# Patient Record
Sex: Male | Born: 1935 | Race: White | Hispanic: No
Health system: Southern US, Community
[De-identification: ages and names within clinical notes are randomized; demographics above are authoritative.]

## PROBLEM LIST (undated history)

## (undated) DIAGNOSIS — F0390 Unspecified dementia without behavioral disturbance: Secondary | ICD-10-CM

## (undated) DIAGNOSIS — F10959 Alcohol use, unspecified with alcohol-induced psychotic disorder, unspecified: Secondary | ICD-10-CM

## (undated) DIAGNOSIS — S7223XA Displaced subtrochanteric fracture of unspecified femur, initial encounter for closed fracture: Secondary | ICD-10-CM

## (undated) DIAGNOSIS — D649 Anemia, unspecified: Secondary | ICD-10-CM

## (undated) DIAGNOSIS — E119 Type 2 diabetes mellitus without complications: Secondary | ICD-10-CM

## (undated) DIAGNOSIS — F1010 Alcohol abuse, uncomplicated: Secondary | ICD-10-CM

## (undated) HISTORY — PX: OTHER SURGICAL HISTORY: SHX169

## (undated) HISTORY — PX: CYSTOSCOPY: SHX5120

## (undated) HISTORY — PX: CHOLECYSTECTOMY: SHX55

---

## 1998-06-12 ENCOUNTER — Emergency Department (HOSPITAL_COMMUNITY): Admission: EM | Admit: 1998-06-12 | Discharge: 1998-06-12 | Payer: Self-pay

## 1998-06-16 ENCOUNTER — Ambulatory Visit (HOSPITAL_COMMUNITY): Admission: RE | Admit: 1998-06-16 | Discharge: 1998-06-16 | Payer: Self-pay

## 1998-07-09 ENCOUNTER — Observation Stay (HOSPITAL_COMMUNITY): Admission: RE | Admit: 1998-07-09 | Discharge: 1998-07-10 | Payer: Self-pay | Admitting: Surgery

## 1998-11-13 ENCOUNTER — Encounter: Payer: Self-pay | Admitting: Emergency Medicine

## 1998-11-13 ENCOUNTER — Emergency Department (HOSPITAL_COMMUNITY): Admission: EM | Admit: 1998-11-13 | Discharge: 1998-11-13 | Payer: Self-pay | Admitting: Emergency Medicine

## 1999-11-27 ENCOUNTER — Emergency Department (HOSPITAL_COMMUNITY): Admission: EM | Admit: 1999-11-27 | Discharge: 1999-11-27 | Payer: Self-pay | Admitting: Emergency Medicine

## 2000-04-08 ENCOUNTER — Emergency Department (HOSPITAL_COMMUNITY): Admission: EM | Admit: 2000-04-08 | Discharge: 2000-04-08 | Payer: Self-pay | Admitting: Emergency Medicine

## 2000-04-09 ENCOUNTER — Encounter: Payer: Self-pay | Admitting: Emergency Medicine

## 2000-10-03 ENCOUNTER — Emergency Department (HOSPITAL_COMMUNITY): Admission: EM | Admit: 2000-10-03 | Discharge: 2000-10-03 | Payer: Self-pay | Admitting: Internal Medicine

## 2000-10-04 ENCOUNTER — Encounter: Payer: Self-pay | Admitting: Otolaryngology

## 2000-10-04 ENCOUNTER — Emergency Department (HOSPITAL_COMMUNITY): Admission: EM | Admit: 2000-10-04 | Discharge: 2000-10-04 | Payer: Self-pay | Admitting: Emergency Medicine

## 2000-10-04 ENCOUNTER — Inpatient Hospital Stay (HOSPITAL_COMMUNITY): Admission: AD | Admit: 2000-10-04 | Discharge: 2000-10-08 | Payer: Self-pay | Admitting: Otolaryngology

## 2002-12-16 ENCOUNTER — Emergency Department (HOSPITAL_COMMUNITY): Admission: EM | Admit: 2002-12-16 | Discharge: 2002-12-17 | Payer: Self-pay | Admitting: Emergency Medicine

## 2002-12-17 ENCOUNTER — Encounter: Payer: Self-pay | Admitting: Emergency Medicine

## 2003-01-14 ENCOUNTER — Inpatient Hospital Stay (HOSPITAL_COMMUNITY): Admission: EM | Admit: 2003-01-14 | Discharge: 2003-02-07 | Payer: Self-pay

## 2004-03-13 ENCOUNTER — Ambulatory Visit (HOSPITAL_COMMUNITY): Admission: RE | Admit: 2004-03-13 | Discharge: 2004-03-13 | Payer: Self-pay | Admitting: Internal Medicine

## 2004-11-22 ENCOUNTER — Emergency Department (HOSPITAL_COMMUNITY): Admission: EM | Admit: 2004-11-22 | Discharge: 2004-11-22 | Payer: Self-pay | Admitting: Emergency Medicine

## 2004-12-21 ENCOUNTER — Ambulatory Visit (HOSPITAL_COMMUNITY): Admission: RE | Admit: 2004-12-21 | Discharge: 2004-12-21 | Payer: Self-pay | Admitting: Urology

## 2007-02-15 ENCOUNTER — Inpatient Hospital Stay (HOSPITAL_COMMUNITY): Admission: EM | Admit: 2007-02-15 | Discharge: 2007-02-17 | Payer: Self-pay | Admitting: Emergency Medicine

## 2007-02-15 ENCOUNTER — Ambulatory Visit: Payer: Self-pay | Admitting: Internal Medicine

## 2007-02-16 ENCOUNTER — Encounter (INDEPENDENT_AMBULATORY_CARE_PROVIDER_SITE_OTHER): Payer: Self-pay | Admitting: Internal Medicine

## 2010-07-14 NOTE — Op Note (Signed)
Kevin Dawson, Kevin Dawson NO.:  [id number]   MEDICAL RECORD NO.:  [id number]          PATIENT TYPE:  INP   LOCATION:  2109                         FACILITY:  [facility]   PHYSICIAN:  Nelda Bucks, MD DATE OF BIRTH:  09-20-1927   DATE OF PROCEDURE:  02/15/2007  DATE OF DISCHARGE:                               OPERATIVE REPORT   CENTRAL LINE NOTE   This is a consult from the teaching service for central line access on a  patient with borderline blood pressure in the emergency room.  This  patient was somewhat agitated with a history of dementia and would not  be cooperative for IJ placement.  Therefore, the location chosen was the  right femoral vein.  This procedure was performed by Dr. Elvera Lennox under  ultrasound direct guidance, and, to note, this patient's vein noted on  ultrasound was right underneath the artery and therefore a somewhat more  lateral approach was taken to have an angle to enter the vein safely.  Patient's right femoral area was prepped extensively with Chlorhexidine  preparation and under ultrasound guidance some lidocaine, 5 mL, 1%  lidocaine was placed for local anesthesia.  I performed this procedure  in supervising type procedure with Dr. Elvera Lennox using Seldinger  technique.  A needle was placed directly under ultrasound guidance into  the vein and a wire was placed successfully and again ultrasound  visualized the wire in the vein and this was compressible femoral vein.  Continuing with Seldinger technique with Chlorhexidine sterile  technique, a central line was placed successfully.  Dark non-pulsatile  blood was noted.  The area was reprepped.  The line was flushed  appropriately and sutured into place.  The patient tolerated this very  very well.      Nelda Bucks, MD  Electronically Signed     DJF/MEDQ  D:  02/15/2007  T:  02/16/2007  Job:  [id number]

## 2010-07-14 NOTE — Discharge Summary (Signed)
NAMEBAIN, WHICHARD NO.:  [id number]   MEDICAL RECORD NO.:  [id number]          PATIENT TYPE:  INP   LOCATION:  5122                         FACILITY:  [facility]   PHYSICIAN:  Alvester Morin, M.D.  DATE OF BIRTH:  Feb 20, 1928   DATE OF ADMISSION:  02/15/2007  DATE OF DISCHARGE:  02/17/2007                               DISCHARGE SUMMARY   DISCHARGE DIAGNOSES:  1. Fever and leukocytosis most likely secondary to pneumonia.  2. Hypotenstion, secondary to dehydration, resolved.  3. Elevated creatinine kinase, probably due to hypotensive episode.  4. Acute on chronic renal failure secondary to dehydration, resolved.  5. Diabetes with a hemoglobin A1c of 5.3.  6. Hypertension.  7. Dementia.  8. Hyperlipidemia - Zocor being held at time of discharge due to      problem # 3   DISCHARGE MEDICATIONS:  1. Avelox 400 mg p.o. qd x 7 days  2. FiberLax 625 two tablets at bedtime  3. Ranitidine 150 mg p.o. daily.  4. Polysporin topical b.i.d.  5. Lisinopril 20 mg by mouth daily - being held.  6. Maxzide 25 mg p.o. daily - being held.  7. Metoprolol 12.5 mg p.o. daily.  8. Razadyne 8 mg p.o. bedtime.  9. Aricept 5 mg p.o. bedtime.  10.Zocor - being held   DISPOSITION:  The patient will be followed up by the physician at his  nursing facility.  His blood pressure medication will need to be added  back as needed. He needs to have his CK checked to assure that it  normalizes and then his Zocor will need to be restarted. He may need to  have meds added back for his diabetes.   PROCEDURES:  None.   CONSULTATIONS:  None.   BRIEF ADMISSION HISTORY AND PHYSICAL:  Mr Boulet is a 77 year old man  with past medical history of traumatic brain injury, hypertension,  diabetes, and dementia who lives in an assisted living facility. He was  brought to the ED after an episode of vomiting the day prior to  admission.  The night prior to admission, he had chills and a  temperature of  101.  He also has a cough.   PHYSICAL EXAMINATION:  VITAL SIGNS:  Temperature 104, blood pressure 131/67, pulse 95,  respirations 24, saturating 96% on room air.  Physical examination was within normal limits except for his  neurological exam. He was alert but oriented only to person. His speech  was fluent but incoherent.   LABORATORY DATA:  Sodium 138, potassium 4.2, chloride 107, bicarbonate  25, BUN 28, creatinine 2.2, glucose 68, white blood cells 12.6, ESE of  12.7, hemoglobin 14.6, MCV 95.5, BNP was 222.  UA was negative.  Chest x-  ray showed limited lung volumes, atelectasis and with vascular  congestion.   HOSPITAL COURSE:  #1 - FEVER AND LEUKOCYTOSIS - The patient had a cough but was unable to  produce sputum for culture. His CXR did not show an infiltrate. Blood  cultures were negative and urinalysis was also negative. He most likely  had a pneumonia not seen on  CXR. He responded to antibiotics and is  discharged on Avelox for an additional 7 days (total of 10 days of  antibiotics).   #2 - HYPOTENSION -  While being evaluated in the ED, the patient's  systolic blood pressure dropped to the 90's. HE received a 2L fluid  bolus to which he responded.  He never required pressors. The  hypotension was most likely due to dehydration.   #3 - ELEVATED CK, most likely due to his episode of hypotension.  Troponins were negative. Zocor was held and can be resumed as outpatient  after CK has normalized.  A TSH was pending at time of discharge.   #4 - ACUTE ON CHRONIC RENAL FAILURE, pre-renal, responded to IVF.  Creatinine was down to 1.7 at discharge (patient's baseline creatinine  ~1.8).   #5 - DIABETES MELLITUS.  Hemoglobin A1C is 5.3.  The patient was on  glipizide 5mg  and Lantus 37 units as an outpatient.  These were held and  the patient only required a minimal amount of regular insulin during his  hospital stay. His meds can be restarted as an outpatient if needed.   #5  - HYPERTENSION.  BP meds were held on admission due to hypotension.  At time of discharge, metoprolol had been restarted. His other BP meds  can be restarted as an outpatient as needed.   #5 - DEMENTIA.  Stable.   VITAL SIGNS:  On the day of discharge, temperature 98.5, blood pressure  130/170, pulse 74, respirations 17. CBGs 96-36.   DISCHARGE LABORATORIES:  White blood cell count 9.8, hemoglobin 13,  platelets 133.  Sodium 143, potassium 3.6, chloride 111, bicarbonate 20,  BUN 21, creatinine 1.7, glucose 88, hemoglobin A1C 5.3, calcium 7.3,  point of cardiac care markers negative.  TSH and LFTs still pending.  Blood cultures pending.      Marinda Elk, M.D.  Electronically Signed      Alvester Morin, M.D.  Electronically Signed    AF/MEDQ  D:  02/17/2007  T:  02/17/2007  Job:  [id number]   cc:   Alvester Morin, M.D.
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
## 2010-07-17 NOTE — Discharge Summary (Signed)
[place]  Patient:    Kevin Dawson, Kevin Dawson                      MRN: [id number] Adm. Date:  [id number] Disc. Date: [id number] Attending:  Sandi Raveling CC:         Dr. Ophelia Charter, Internal Medicine   Discharge Summary  ADMISSION DIAGNOSES: 1. Severe right posterior epistaxis. 2. Hypertension. 3. Non-insulin-dependent diabetes mellitus. 4. Hypokalemia. 5. History of fractured ankle in 1999.  DISCHARGE DIAGNOSES: 1. Severe right posterior epistaxis. 2. Hypertension. 3. Non-insulin-dependent diabetes mellitus. 4. Hypokalemia. 5. History of fractured ankle in 1999.  OPERATIONS:  None.  PROCEDURES:  Right posterior nasal pack using a 14-gauge Foley catheter.  CONSULTATIONS:  Internal medicine.  COMPLICATIONS:  None.  CONDITION ON DISCHARGE:  Stable.  HISTORY OF PRESENT ILLNESS:  Kevin Dawson is a very pleasant 77 year old white male admitted to [facility] on October 04, 2000 with a diagnosis of severe, spontaneous, right-sided posterior epistaxis.  He had been seen at [facility] Emergency Room by Dr. Ysidro Evert and had had an anterior pack.  He had been back and forth to the emergency room approximately three times.  He was to be seen in my office on October 03, 2000 but did not show up.  On October 04, 2000, he presented with continued moderately severe right posterior epistaxis from the sphenopalatine artery.  A #14-gauge Foley posterior nasal pack was placed in the office and controlled the bleeding.  A greater palatine block was also done, which helped to control the bleeding.  Kevin Dawson was admitted to the hospital on October 04, 2000 for monitoring for further bleeding, nasal O2, and control of his hypertension.  He was also a non-insulin-dependent diabetic on diet control.  He had had a history of a fractured ankle in 1999 and a previous cholecystectomy.  The patient had an uncomplicated afebrile postoperative  course.  The posterior pack controlled the right-sided posterior epistaxis.  He did expectorate dark clots virtually every day but had no major active bleeding.  He had entered the hospital on lisinopril 20 mg p.o. b.i.d., atenolol 25 mg p.o. q.d., ranitidine 150 mg b.i.d., and aspirin 325 mg p.o. q.d.  The aspirin was stopped.  An internal medicine consult was requested.  He was treated with one dose of clonidine 0.1 mg and eventually was placed on hydrochlorothiazide 25 mg p.o. q.d.  The lisinopril was increased to 40 mg b.i.d.  By the 5th packed day, his blood pressure was 120/70 and he was awake, alert, and had stable vital signs.  His admission hemoglobin was 15.2, hematocrit 42.4, white blood cell count 8800.  His initial platelet count was 157,000.  PT was 13.6, PTT 29, and INR 1.1; again on admission.  During the hospitalization, he had serial CBCs.  By October 07, 2000, his hemoglobin was 15.8, hematocrit 44, white blood cell count 8500, platelets 181,000.  He did have hypokalemia on his initial electrolyte evaluation with a potassium of 3.1.  He was given some potassium and by October 08, 2000 his potassium was 3.5 with stable electrolytes otherwise. Admission urinalysis was unremarkable.  The patient had no active bleeding during the hospital course.  He was awake, alert, and afebrile and was not hypoxemic.  On the 5th packed day, the Foley catheter was removed and there was no active bleeding.  He was observed for approximately 45 minutes.  There was no active  bleeding.  He was recommended for discharge at 5 p.m. on October 08, 2000, if he continued to have no bleeding.  At the time of hospitalization, Mr. Walle other laboratory data included a hemoglobin A1c of 5.6.  Chest x-ray showed cardiomegaly with some interstitial prominence and bibasilar atelectasis or scarring, which was unchanged from a previous chest x-ray and changes of COPD.  EKG showed a normal sinus  rhythm with a first-degree AV block.  Other pertinent laboratory data was given above in the body of the report.  Kevin Dawson was recommended for discharge on October 08, 2000 at 5 p.m. if he had no further bleeding.  I discussed his discharge with Dr. Ophelia Charter of internal medicine.  He was to return to my office in one week for follow-up, as well as to see Dr. Ophelia Charter in the internal medicine clinic in approximately one week for blood pressure management.  He was placed on a no added salt 1800 calorie ADA diet and limited indoor activity for the next week.  He was to avoid aspirin or aspirin products for the next week.  DISCHARGE MEDICATIONS: 1. Augmentin 875 mg p.o. b.i.d. x 10 days. 2. Saline nasal spray q.i.d. 3. Darvocet-N 100 one to two p.o. q.4h. p.r.n. pain. 4. Blood pressure medications as follows:  Lisinopril 40 mg p.o. b.i.d.,    atenolol 25 mg p.o. q.d., ranitidine 150 mg p.o. b.i.d., and    hydrochlorothiazide 25 mg p.o. q.d.  He was not placed on supplemental    potassium.  DISCHARGE INSTRUCTIONS:  He was to call [phone] for any post-pack problems and for internal medicine if he has any problems with his blood pressure medications.  There were no pathologic specimens pending.  During hospitalization, Mr. Baumgart was on 3700, room 3704, in a monitored bed.  It was recommended to him that he have someone stay with him over the next several evenings and days to monitor him for any further epistaxis.  If the patient should have further epistaxis, I would recommend a right transantral artery, internal maxillary and sphenopalatine artery ligation, and/or an embolization of the internal maxillary artery/sphenopalatine artery on the right by invasive radiology. DD:  10/08/00 TD:  10/09/00 Job: [id number]
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
## 2010-08-02 ENCOUNTER — Emergency Department (HOSPITAL_COMMUNITY): Payer: Medicare Other

## 2010-08-02 ENCOUNTER — Observation Stay (HOSPITAL_COMMUNITY)
Admission: EM | Admit: 2010-08-02 | Discharge: 2010-08-02 | Disposition: A | Discharge status: Home / Self Care | Payer: Medicare Other | Attending: Emergency Medicine | Admitting: Emergency Medicine

## 2010-08-02 DIAGNOSIS — Z7982 Long term (current) use of aspirin: Secondary | ICD-10-CM | POA: Insufficient documentation

## 2010-08-02 DIAGNOSIS — F0390 Unspecified dementia without behavioral disturbance: Secondary | ICD-10-CM | POA: Insufficient documentation

## 2010-08-02 DIAGNOSIS — Z79899 Other long term (current) drug therapy: Secondary | ICD-10-CM | POA: Insufficient documentation

## 2010-08-02 DIAGNOSIS — E1169 Type 2 diabetes mellitus with other specified complication: Principal | ICD-10-CM | POA: Insufficient documentation

## 2010-08-02 DIAGNOSIS — E7800 Pure hypercholesterolemia, unspecified: Secondary | ICD-10-CM | POA: Insufficient documentation

## 2010-08-02 DIAGNOSIS — R4182 Altered mental status, unspecified: Secondary | ICD-10-CM | POA: Insufficient documentation

## 2010-08-02 DIAGNOSIS — I10 Essential (primary) hypertension: Secondary | ICD-10-CM | POA: Insufficient documentation

## 2010-08-02 LAB — POCT I-STAT, CHEM 8
BUN: 23 mg/dL (ref 6–23)
Calcium, Ion: 1.08 mmol/L — ABNORMAL LOW (ref 1.12–1.32)
Chloride: 104 mEq/L (ref 96–112)
Creatinine, Ser: 1.4 mg/dL (ref 0.4–1.5)
Glucose, Bld: 202 mg/dL — ABNORMAL HIGH (ref 70–99)
HCT: 45 % (ref 39.0–52.0)
Hemoglobin: 15.3 g/dL (ref 13.0–17.0)
Potassium: 3.6 mEq/L (ref 3.5–5.1)
Sodium: 138 mEq/L (ref 135–145)
TCO2: 25 mmol/L (ref 0–100)

## 2010-08-02 LAB — DIFFERENTIAL
Basophils Absolute: 0 10*3/uL (ref 0.0–0.1)
Basophils Relative: 0 % (ref 0–1)
Eosinophils Absolute: 0.2 10*3/uL (ref 0.0–0.7)
Eosinophils Relative: 2 % (ref 0–5)
Lymphocytes Relative: 7 % — ABNORMAL LOW (ref 12–46)
Lymphs Abs: 0.7 10*3/uL (ref 0.7–4.0)
Monocytes Absolute: 0.5 10*3/uL (ref 0.1–1.0)
Monocytes Relative: 5 % (ref 3–12)
Neutro Abs: 8.7 10*3/uL — ABNORMAL HIGH (ref 1.7–7.7)
Neutrophils Relative %: 86 % — ABNORMAL HIGH (ref 43–77)

## 2010-08-02 LAB — BASIC METABOLIC PANEL
Calcium: 8.9 mg/dL (ref 8.4–10.5)
GFR calc Af Amer: >60 mL/min (ref >60)
GFR calc non Af Amer: >60 mL/min (ref >60)
Glucose, Bld: 176 mg/dL — ABNORMAL HIGH (ref 70–99)
Potassium: 3.2 mEq/L — ABNORMAL LOW (ref 3.5–5.1)
Sodium: 139 mEq/L (ref 135–145)

## 2010-08-02 LAB — GLUCOSE, CAPILLARY
Glucose-Capillary: 19 mg/dL — CL (ref 70–99)
Glucose-Capillary: 101 mg/dL — ABNORMAL HIGH (ref 70–99)
Glucose-Capillary: 158 mg/dL — ABNORMAL HIGH (ref 70–99)
Glucose-Capillary: 136 mg/dL — ABNORMAL HIGH (ref 70–99)

## 2010-08-02 LAB — CBC
HCT: 42.9 % (ref 39.0–52.0)
Hemoglobin: 15.3 g/dL (ref 13.0–17.0)
MCH: 34.5 pg — ABNORMAL HIGH (ref 26.0–34.0)
MCHC: 35.7 g/dL (ref 30.0–36.0)
MCV: 96.8 fL (ref 78.0–100.0)
Platelets: 147 10*3/uL — ABNORMAL LOW (ref 150–400)
RBC: 4.43 MIL/uL (ref 4.22–5.81)
RDW: 12.9 % (ref 11.5–15.5)
WBC: 10 10*3/uL (ref 4.0–10.5)

## 2010-08-02 LAB — URINALYSIS, ROUTINE W REFLEX MICROSCOPIC
Glucose, UA: 100 mg/dL — AB
Hgb urine dipstick: NEGATIVE
Protein, ur: NEGATIVE mg/dL
Specific Gravity, Urine: 1.013 (ref 1.005–1.030)
pH: 7 (ref 5.0–8.0)

## 2010-08-02 LAB — COMPREHENSIVE METABOLIC PANEL
AST: 32 U/L (ref 0–37)
Albumin: 3.3 g/dL — ABNORMAL LOW (ref 3.5–5.2)
BUN: 18 mg/dL (ref 6–23)
Calcium: 8.7 mg/dL (ref 8.4–10.5)
Chloride: 103 mEq/L (ref 96–112)
Creatinine, Ser: 1.11 mg/dL (ref 0.4–1.5)
GFR calc Af Amer: >60 mL/min (ref >60)
Total Bilirubin: 0.5 mg/dL (ref 0.3–1.2)
Total Protein: 6.3 g/dL (ref 6.0–8.3)

## 2010-08-03 LAB — URINE CULTURE: Culture  Setup Time: 201206031729

## 2010-12-04 LAB — CBC
HCT: 37.3 — ABNORMAL LOW
Platelets: 133 — ABNORMAL LOW
Platelets: 135 — ABNORMAL LOW
Platelets: 164
RBC: 3.7 — ABNORMAL LOW
RBC: 3.72 — ABNORMAL LOW
RBC: 4.2 — ABNORMAL LOW
WBC: 12.6 — ABNORMAL HIGH
WBC: 13.9 — ABNORMAL HIGH
WBC: 9.8

## 2010-12-04 LAB — BASIC METABOLIC PANEL
Calcium: 7.3 — ABNORMAL LOW
Chloride: 111
Creatinine, Ser: 1.79 — ABNORMAL HIGH
GFR calc Af Amer: 45 — ABNORMAL LOW
GFR calc non Af Amer: 37 — ABNORMAL LOW

## 2010-12-04 LAB — COMPREHENSIVE METABOLIC PANEL
ALT: 19
AST: 24
AST: 37
Albumin: 2.8 — ABNORMAL LOW
Albumin: 3.2 — ABNORMAL LOW
Alkaline Phosphatase: 51
Alkaline Phosphatase: 69
BUN: 27 — ABNORMAL HIGH
CO2: 25
Chloride: 107
Chloride: 112
GFR calc Af Amer: 34 — ABNORMAL LOW
Potassium: 4.2
Potassium: 4.2
Total Bilirubin: 1.5 — ABNORMAL HIGH
Total Bilirubin: 1.5 — ABNORMAL HIGH

## 2010-12-04 LAB — URINALYSIS, ROUTINE W REFLEX MICROSCOPIC
Bilirubin Urine: NEGATIVE
Glucose, UA: NEGATIVE
Nitrite: NEGATIVE
Specific Gravity, Urine: 1.017
pH: 7

## 2010-12-04 LAB — CARDIAC PANEL(CRET KIN+CKTOT+MB+TROPI)
CK, MB: 11.7 — ABNORMAL HIGH
Relative Index: 1.3
Relative Index: 1.4
Total CK: 895 — ABNORMAL HIGH
Troponin I: 0.03
Troponin I: 0.04
Troponin I: 0.05

## 2010-12-04 LAB — LEGIONELLA ANTIGEN, URINE: Legionella Antigen, Urine: NEGATIVE

## 2010-12-04 LAB — LIPID PANEL
Total CHOL/HDL Ratio: 3.6
VLDL: 12

## 2010-12-04 LAB — DIFFERENTIAL
Basophils Absolute: 0
Basophils Absolute: 0
Basophils Relative: 0
Basophils Relative: 0
Eosinophils Absolute: 0 — ABNORMAL LOW
Eosinophils Relative: 0
Eosinophils Relative: 0
Monocytes Absolute: 0.2
Monocytes Absolute: 0.7

## 2010-12-04 LAB — HEPATIC FUNCTION PANEL
ALT: 29
AST: 47 — ABNORMAL HIGH
Albumin: 2.8 — ABNORMAL LOW
Alkaline Phosphatase: 49
Total Protein: 5.6 — ABNORMAL LOW

## 2010-12-04 LAB — CULTURE, BLOOD (ROUTINE X 2): Culture: NO GROWTH

## 2010-12-04 LAB — CARBOXYHEMOGLOBIN: Total hemoglobin: 12.7 — ABNORMAL LOW

## 2010-12-04 LAB — SODIUM, URINE, RANDOM: Sodium, Ur: 65

## 2012-02-09 ENCOUNTER — Emergency Department (HOSPITAL_COMMUNITY): Payer: Medicare Other

## 2012-02-09 ENCOUNTER — Observation Stay (HOSPITAL_COMMUNITY)
Admission: EM | Admit: 2012-02-09 | Discharge: 2012-02-11 | Payer: Medicare Other | Attending: Family Medicine | Admitting: Family Medicine

## 2012-02-09 ENCOUNTER — Encounter (HOSPITAL_COMMUNITY): Payer: Self-pay | Admitting: Emergency Medicine

## 2012-02-09 DIAGNOSIS — G9340 Encephalopathy, unspecified: Secondary | ICD-10-CM

## 2012-02-09 DIAGNOSIS — E1169 Type 2 diabetes mellitus with other specified complication: Secondary | ICD-10-CM | POA: Insufficient documentation

## 2012-02-09 DIAGNOSIS — F03C0 Unspecified dementia, severe, without behavioral disturbance, psychotic disturbance, mood disturbance, and anxiety: Secondary | ICD-10-CM

## 2012-02-09 DIAGNOSIS — G9349 Other encephalopathy: Principal | ICD-10-CM | POA: Insufficient documentation

## 2012-02-09 DIAGNOSIS — E162 Hypoglycemia, unspecified: Secondary | ICD-10-CM | POA: Diagnosis present

## 2012-02-09 DIAGNOSIS — F0390 Unspecified dementia without behavioral disturbance: Secondary | ICD-10-CM

## 2012-02-09 DIAGNOSIS — E119 Type 2 diabetes mellitus without complications: Secondary | ICD-10-CM

## 2012-02-09 DIAGNOSIS — N179 Acute kidney failure, unspecified: Secondary | ICD-10-CM

## 2012-02-09 DIAGNOSIS — I10 Essential (primary) hypertension: Secondary | ICD-10-CM

## 2012-02-09 DIAGNOSIS — Z79899 Other long term (current) drug therapy: Secondary | ICD-10-CM | POA: Insufficient documentation

## 2012-02-09 DIAGNOSIS — I4891 Unspecified atrial fibrillation: Secondary | ICD-10-CM | POA: Insufficient documentation

## 2012-02-09 DIAGNOSIS — F068 Other specified mental disorders due to known physiological condition: Secondary | ICD-10-CM

## 2012-02-09 DIAGNOSIS — Z794 Long term (current) use of insulin: Secondary | ICD-10-CM | POA: Insufficient documentation

## 2012-02-09 HISTORY — DX: Type 2 diabetes mellitus without complications: E11.9

## 2012-02-09 HISTORY — DX: Anemia, unspecified: D64.9

## 2012-02-09 HISTORY — DX: Unspecified dementia, unspecified severity, without behavioral disturbance, psychotic disturbance, mood disturbance, and anxiety: F03.90

## 2012-02-09 HISTORY — DX: Displaced subtrochanteric fracture of unspecified femur, initial encounter for closed fracture: S72.23XA

## 2012-02-09 HISTORY — DX: Alcohol abuse, uncomplicated: F10.10

## 2012-02-09 HISTORY — DX: Alcohol use, unspecified with alcohol-induced psychotic disorder, unspecified: F10.959

## 2012-02-09 LAB — URINALYSIS, ROUTINE W REFLEX MICROSCOPIC
Bilirubin Urine: NEGATIVE
Hgb urine dipstick: NEGATIVE
Nitrite: NEGATIVE
Protein, ur: NEGATIVE mg/dL
Specific Gravity, Urine: 1.012 (ref 1.005–1.030)
Urobilinogen, UA: 0.2 mg/dL (ref 0.0–1.0)

## 2012-02-09 LAB — COMPREHENSIVE METABOLIC PANEL
ALT: 13 U/L (ref 0–53)
Alkaline Phosphatase: 56 U/L (ref 39–117)
BUN: 31 mg/dL — ABNORMAL HIGH (ref 6–23)
CO2: 23 mEq/L (ref 19–32)
Calcium: 9 mg/dL (ref 8.4–10.5)
GFR calc Af Amer: 46 mL/min — ABNORMAL LOW (ref >90)
GFR calc non Af Amer: 40 mL/min — ABNORMAL LOW (ref >90)
Glucose, Bld: 41 mg/dL — CL (ref 70–99)
Potassium: 3.3 mEq/L — ABNORMAL LOW (ref 3.5–5.1)
Total Protein: 6 g/dL (ref 6.0–8.3)

## 2012-02-09 LAB — CBC
HCT: 40.2 % (ref 39.0–52.0)
Hemoglobin: 13.9 g/dL (ref 13.0–17.0)
MCH: 34.5 pg — ABNORMAL HIGH (ref 26.0–34.0)
MCHC: 34.6 g/dL (ref 30.0–36.0)
RBC: 4.03 MIL/uL — ABNORMAL LOW (ref 4.22–5.81)

## 2012-02-09 LAB — GLUCOSE, CAPILLARY
Glucose-Capillary: 65 mg/dL — ABNORMAL LOW (ref 70–99)
Glucose-Capillary: 70 mg/dL (ref 70–99)
Glucose-Capillary: 62 mg/dL — ABNORMAL LOW (ref 70–99)
Glucose-Capillary: 28 mg/dL — CL (ref 70–99)
Glucose-Capillary: 96 mg/dL (ref 70–99)
Glucose-Capillary: 133 mg/dL — ABNORMAL HIGH (ref 70–99)

## 2012-02-09 LAB — ETHANOL: Alcohol, Ethyl (B): <11 mg/dL (ref 0–11)

## 2012-02-09 MED ORDER — ONDANSETRON HCL 4 MG/2ML IJ SOLN: 4.0000 mg | Freq: Four times a day (QID) | INTRAMUSCULAR | Status: DC | PRN

## 2012-02-09 MED ORDER — DEXTROSE 50 % IV SOLN: 1.0000 | INTRAVENOUS | Status: DC | PRN

## 2012-02-09 MED ORDER — METOPROLOL TARTRATE 50 MG PO TABS
50.0000 mg | ORAL_TABLET | Freq: Every morning | ORAL | Status: DC
  Administered 2012-02-10 – 2012-02-11 (×2): 50 mg via ORAL
  Filled 2012-02-09 (×2): qty 1

## 2012-02-09 MED ORDER — SODIUM CHLORIDE 0.9 % IV SOLN
Freq: Once | INTRAVENOUS | Status: AC
  Administered 2012-02-09: 20 mL/h via INTRAVENOUS

## 2012-02-09 MED ORDER — SODIUM CHLORIDE 4 MEQ/ML IV SOLN: INTRAVENOUS | Status: DC

## 2012-02-09 MED ORDER — INSULIN GLARGINE 100 UNIT/ML ~~LOC~~ SOLN: 27.0000 [IU] | Freq: Every day | SUBCUTANEOUS | Status: DC

## 2012-02-09 MED ORDER — ASPIRIN EC 81 MG PO TBEC
81.0000 mg | DELAYED_RELEASE_TABLET | Freq: Every day | ORAL | Status: DC
  Administered 2012-02-09 – 2012-02-11 (×3): 81 mg via ORAL
  Filled 2012-02-09 (×3): qty 1

## 2012-02-09 MED ORDER — ONDANSETRON HCL 4 MG/2ML IJ SOLN: 4.0000 mg | Freq: Three times a day (TID) | INTRAMUSCULAR | Status: AC | PRN

## 2012-02-09 MED ORDER — DOCUSATE SODIUM 100 MG PO CAPS
100.0000 mg | ORAL_CAPSULE | Freq: Every day | ORAL | Status: DC
  Administered 2012-02-09 – 2012-02-11 (×3): 100 mg via ORAL
  Filled 2012-02-09 (×3): qty 1

## 2012-02-09 MED ORDER — DEXTROSE 50 % IV SOLN
INTRAVENOUS | Status: AC
  Administered 2012-02-09: 50 mL
  Filled 2012-02-09: qty 50

## 2012-02-09 MED ORDER — GALANTAMINE HYDROBROMIDE ER 8 MG PO CP24: 8.0000 mg | ORAL_CAPSULE | Freq: Every day | ORAL | Status: DC

## 2012-02-09 MED ORDER — SODIUM CHLORIDE 4 MEQ/ML IV SOLN
INTRAVENOUS | Status: DC
  Administered 2012-02-09: 09:00:00 via INTRAVENOUS
  Filled 2012-02-09: qty 1000

## 2012-02-09 MED ORDER — DEXTROSE 10 % IV SOLN
INTRAVENOUS | Status: DC
  Administered 2012-02-09: 23:00:00 via INTRAVENOUS
  Filled 2012-02-09 (×3): qty 1000

## 2012-02-09 MED ORDER — SODIUM CHLORIDE 4 MEQ/ML IV SOLN
INTRAVENOUS | Status: DC
  Filled 2012-02-09: qty 1000

## 2012-02-09 MED ORDER — POLYETHYLENE GLYCOL 3350 17 G PO PACK
17.0000 g | PACK | Freq: Every day | ORAL | Status: DC | PRN
  Filled 2012-02-09: qty 1

## 2012-02-09 MED ORDER — METOPROLOL TARTRATE 25 MG PO TABS
25.0000 mg | ORAL_TABLET | Freq: Two times a day (BID) | ORAL | Status: DC
  Filled 2012-02-09: qty 2

## 2012-02-09 MED ORDER — HEPARIN SODIUM (PORCINE) 5000 UNIT/ML IJ SOLN
5000.0000 [IU] | Freq: Three times a day (TID) | INTRAMUSCULAR | Status: DC
  Administered 2012-02-09 – 2012-02-11 (×6): 5000 [IU] via SUBCUTANEOUS
  Filled 2012-02-09 (×9): qty 1

## 2012-02-09 MED ORDER — METOPROLOL TARTRATE 25 MG PO TABS
25.0000 mg | ORAL_TABLET | Freq: Every evening | ORAL | Status: DC
  Administered 2012-02-10: 25 mg via ORAL
  Filled 2012-02-09 (×3): qty 1

## 2012-02-09 MED ORDER — GALANTAMINE HYDROBROMIDE ER 8 MG PO CP24
8.0000 mg | ORAL_CAPSULE | Freq: Every day | ORAL | Status: DC
  Filled 2012-02-09 (×2): qty 1

## 2012-02-09 MED ORDER — ONDANSETRON HCL 4 MG PO TABS: 4.0000 mg | ORAL_TABLET | Freq: Four times a day (QID) | ORAL | Status: DC | PRN

## 2012-02-09 NOTE — ED Notes (Signed)
CBG read low, md informed, 2 IV sites started

## 2012-02-09 NOTE — ED Notes (Signed)
Bed:WA09<BR> Expected date:<BR> Expected time:<BR> Means of arrival:<BR> Comments:<BR> EMS

## 2012-02-09 NOTE — ED Notes (Signed)
Pt was A-fib on monitor with hx of it. Pt not oriented x4. When asked where he was pt mumbled, "I always know where." but was unable to identify place and kept repeating stating. Hx of dementia. Pt kept pulling at EKG lines, but was cooperative when asked not to. With good waveform, O2 saturations are 96% on room air. Informed during report that no urine was collected during previous stay, but was uninformed if pt urinated at all or not. Pt in and out cathed with 600 mL of urine output.

## 2012-02-09 NOTE — ED Notes (Signed)
Meal given per Dr. Rulon Abide. D10 running at 40ml/hr. Will check CBG post-meal.
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
## 2012-02-09 NOTE — ED Notes (Signed)
Pt given OJ 

## 2012-02-09 NOTE — ED Notes (Signed)
Patient last CBG 96

## 2012-02-09 NOTE — Progress Notes (Signed)
Assisted pt with signing IM pcp is dr Rolene Course epic updated
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
## 2012-02-09 NOTE — ED Provider Notes (Addendum)
History     CSN: [id number]  Arrival date & time 02/09/12  0506   First MD Initiated Contact with Patient 02/09/12 [phone]      Chief Complaint  Patient presents with  . Altered Mental Status  . Aggressive Behavior   Level V caveat for severe dementia  (Consider location/radiation/quality/duration/timing/severity/associated sxs/prior treatment) HPIJames S Dawson is a 78 y.o. male presents from [facility] assisted-living due to combative behavior upon waking up this morning, patient woke up and started swinging. Patient hit his hand on the wall and suffered a small skin tear in the right hand. Per EMS report is calm cooperative on their arrival. Patient complains about nothing.    Past Medical History  Diagnosis Date  . Dementia   . Anemia   . Diabetes mellitus without complication   . Alcoholic psychosis   . Alcohol abuse   . Subtrochanteric fracture     History reviewed. No pertinent past surgical history.  History reviewed. No pertinent family history.  History  Substance Use Topics  . Smoking status: Not on file  . Smokeless tobacco: Not on file  . Alcohol Use: No      Review of Systems Level V caveat due to severe dementia Allergies  Review of patient's allergies indicates no known allergies.  Home Medications   Current Outpatient Rx  Name  Route  Sig  Dispense  Refill  . ACETAMINOPHEN 325 MG PO TABS   Oral   Take 650 mg by mouth every 6 (six) hours as needed. For pain         . ASPIRIN EC 81 MG PO TBEC   Oral   Take 81 mg by mouth daily.         Marland Kitchen PX B COMPLEX/VITAMIN C PO   Oral   Take 1 tablet by mouth daily.         Marland Kitchen DOCUSATE SODIUM 100 MG PO CAPS   Oral   Take 100 mg by mouth daily.         Marland Kitchen GALANTAMINE HYDROBROMIDE ER 8 MG PO CP24   Oral   Take 8 mg by mouth daily with breakfast.         . INSULIN GLARGINE 100 UNIT/ML  SOLN   Subcutaneous   Inject 27 Units into the skin at bedtime.         Marland Kitchen LISINOPRIL 20 MG PO  TABS   Oral   Take 20 mg by mouth daily.         Marland Kitchen LOPERAMIDE HCL 2 MG PO CAPS   Oral   Take 2 mg by mouth 4 (four) times daily as needed. For loose stool         . METOPROLOL TARTRATE 50 MG PO TABS   Oral   Take 25-50 mg by mouth 2 (two) times daily. Take 1 tablet (50mg ) in the morning Take 0.5 (25mg ) in the evening         . DAILY VITE PO   Oral   Take 1 tablet by mouth daily.         Marland Kitchen RANITIDINE HCL 150 MG PO TABS   Oral   Take 150 mg by mouth at bedtime.         . TRIAMTERENE-HCTZ 37.5-25 MG PO TABS   Oral   Take 1 tablet by mouth daily.           BP 103/59  Pulse 57  Temp 98.2 F (36.8 C) (Oral)  SpO2  93%  Physical Exam  Nursing notes reviewed.  Electronic medical record reviewed. VITAL SIGNS:   Filed Vitals:   02/09/12 0520 02/09/12 0729  BP: 103/59 115/51  Pulse: 57 53  Temp: 98.2 F (36.8 C)   TempSrc: Oral   Resp:  16  SpO2: 93% 99%   CONSTITUTIONAL: Awake, alert, not oriented - when asked his name he mumbles something, appears non-toxic HENT: Atraumatic, normocephalic, oral mucosa pink and moist, airway patent. Nares patent without drainage. External ears normal. EYES: Conjunctiva clear, EOMI, PERRLA NECK: Trachea midline, non-tender, supple CARDIOVASCULAR: Normal heart rate, Normal rhythm, No murmurs, rubs, gallops PULMONARY/CHEST: Clear to auscultation, no rhonchi, wheezes, or rales. Symmetrical breath sounds. Non-tender. ABDOMINAL: Non-distended, soft, non-tender - no rebound or guarding.  BS normal. NEUROLOGIC: Non-focal, moving all four extremities, no gross sensory or motor deficits. EXTREMITIES: No clubbing, cyanosis. 1+ edema bilateral lower extremities changes consistent with stasis dermatitis. Small skin tear over MTP of third digit on right hand. SKIN: Warm, Dry, No erythema, No rash  ED Course  Procedures (including critical care time)  Labs Reviewed  CBC - Abnormal; Notable for the following:    RBC 4.03 (*)     MCH  34.5 (*)     All other components within normal limits  ETHANOL  COMPREHENSIVE METABOLIC PANEL  URINE RAPID DRUG SCREEN (HOSP PERFORMED)  URINALYSIS, ROUTINE W REFLEX MICROSCOPIC   Dg Chest 2 View  02/09/2012  *RADIOLOGY REPORT*  Clinical Data: Altered mental status  CHEST - 2 VIEW  Comparison:  Films same day  Findings: Cardiomediastinal silhouette is stable.  Slight improvement in aeration right base.  Mild residual basilar atelectasis.  No segmental infiltrate or pulmonary edema. Degenerative changes thoracic spine.  IMPRESSION: Slight improvement in aeration right base.  Mild basilar atelectasis.  No segmental infiltrate or pulmonary edema.   Original Report Authenticated By: Natasha Mead, M.D.    Ct Head Wo Contrast  02/09/2012  *RADIOLOGY REPORT*  Clinical Data: Change in mental status, dementia  CT HEAD WITHOUT CONTRAST  Technique:  Contiguous axial images were obtained from the base of the skull through the vertex without contrast.  Comparison: 08/02/2010  Findings: Atherosclerotic and physiologic intracranial calcifications.  Stable left temporal encephalomalacia with ex vacuo dilatation of the temporal horn left lateral ventricle as before.  Moderate diffuse  parenchymal atrophy. Patchy areas of hypoattenuation in deep and periventricular white matter bilaterally. Negative for acute intracranial hemorrhage, mass lesion, acute infarction, midline shift, or mass-effect. Acute infarct may be inapparent on noncontrast CT. Ventricles and sulci prominent, stable. Bone windows demonstrate no focal lesion.  IMPRESSION:  1. Negative for bleed or other acute intracranial process.  2. Atrophy and nonspecific white matter changes.   Original Report Authenticated By: D. Andria Rhein, MD    Dg Chest Portable 1 View  02/09/2012  *RADIOLOGY REPORT*  Clinical Data: Altered mental status, dementia  PORTABLE CHEST - 1 VIEW  Comparison: 08/02/2010  Findings: Cardiomegaly again noted.  No pulmonary edema.  Small  amount of right basilar atelectasis or infiltrate.  Bony thorax is stable.  IMPRESSION: .  No pulmonary edema.  Small right basilar atelectasis or infiltrate.   Original Report Authenticated By: Natasha Mead, M.D.    Dg Hand Complete Right  02/09/2012  *RADIOLOGY REPORT*  Clinical Data: Laceration  RIGHT HAND - COMPLETE 3+ VIEW  Comparison: None.  Findings: Negative for fracture, dislocation, or other acute abnormality.  Normal alignment and mineralization. No significant degenerative change.  Regional soft tissues unremarkable.  IMPRESSION:  Negative   Original Report Authenticated By: D. Andria Rhein, MD      1. Severe dementia   2. Hypoglycemia       MDM  Kevin Dawson is a 78 y.o. male with severe dementia and diabetes who awoke and was combative and apparently confused. Patient is mumbling on my assessment but is calm and collected. Patient has a low blood sugar, if given him an amp of D50.  Repeat checking his CBG, found it to be low again at the patient on a T10 drip after giving him another amp of D50. After this, his blood sugar increased. Keeping the patient in the TCU for observation and repeat CBGs as the patient is only taking Lantus.  02/09/2012 5:09 PM Patient most recent CBG is 60, he still has D.10 running at a low-level, the patient to eat and reassessed. Patient may return to [facility] assisted living with stable CBG and if staff can assess blood glucose levels over-night.  D/W Suzette Battiest at [facility] - checked CBG from last night, was 105 and then he received his insulin.  I told her we may send the patient back to them, but to hold insulin tonight, patient must have his glucose checked about every 2 hours tonight.  I explained the diagnosis and have given explicit precautions to return to the ER including any other new or worsening symptoms.  The patient is STABLE and is discharged to home in good condition.   02/09/2012 6:04 PM Patient had a  another low CBG with glucose of 28. Will discuss with [facility] hospitalist for admission for observation 2/2 recurrent hypoglycemia    Jones Skene, MD 02/09/12 1805  Jones Skene, MD 02/09/12 2956

## 2012-02-10 DIAGNOSIS — G9340 Encephalopathy, unspecified: Secondary | ICD-10-CM

## 2012-02-10 DIAGNOSIS — E119 Type 2 diabetes mellitus without complications: Secondary | ICD-10-CM

## 2012-02-10 LAB — GLUCOSE, CAPILLARY
Glucose-Capillary: 106 mg/dL — ABNORMAL HIGH (ref 70–99)
Glucose-Capillary: 139 mg/dL — ABNORMAL HIGH (ref 70–99)
Glucose-Capillary: 130 mg/dL — ABNORMAL HIGH (ref 70–99)
Glucose-Capillary: 145 mg/dL — ABNORMAL HIGH (ref 70–99)
Glucose-Capillary: 139 mg/dL — ABNORMAL HIGH (ref 70–99)
Glucose-Capillary: 126 mg/dL — ABNORMAL HIGH (ref 70–99)
Glucose-Capillary: 127 mg/dL — ABNORMAL HIGH (ref 70–99)
Glucose-Capillary: 90 mg/dL (ref 70–99)

## 2012-02-10 LAB — COMPREHENSIVE METABOLIC PANEL
ALT: 14 U/L (ref 0–53)
Calcium: 8.9 mg/dL (ref 8.4–10.5)
GFR calc Af Amer: 59 mL/min — ABNORMAL LOW (ref >90)
Glucose, Bld: 151 mg/dL — ABNORMAL HIGH (ref 70–99)
Sodium: 142 mEq/L (ref 135–145)
Total Protein: 6.2 g/dL (ref 6.0–8.3)

## 2012-02-10 LAB — CBC
HCT: 41 % (ref 39.0–52.0)
Hemoglobin: 14.3 g/dL (ref 13.0–17.0)
MCHC: 34.9 g/dL (ref 30.0–36.0)
WBC: 6.7 10*3/uL (ref 4.0–10.5)

## 2012-02-10 MED ORDER — LORAZEPAM 0.5 MG PO TABS
0.5000 mg | ORAL_TABLET | Freq: Four times a day (QID) | ORAL | Status: DC | PRN
  Administered 2012-02-10 (×2): 0.5 mg via ORAL
  Filled 2012-02-10 (×2): qty 1

## 2012-02-10 NOTE — Progress Notes (Signed)
Left message for Priscella Mann, at [phone] regarding bringing in medication (razadyne) for the patient to take because pharmacy is unable to get the medication.

## 2012-02-10 NOTE — Progress Notes (Signed)
Pt attempting to get out of chair without assist frequently.  Found standing in front of chair, chair alarm going off assist back to chair. Pt mumbles and throws tissue box down also swings arms as if to hit  nurse.  Reassurance given, pt now calm.  Sat with pt for a few minutes to make sure he is not going to attempt out of chair.  Chair alarm on. Encouraged pt not to get out of chair without assistance.

## 2012-02-10 NOTE — Progress Notes (Signed)
[facility] PROGRESS NOTE  Kevin Dawson NWG:[id number] DOB: Jul 04, 1927 DOA: 02/09/2012 PCP: Binnie Kand, MD  Assessment/Plan: 1. Acute encephalopathy--appears resolved. Presumed secondary to profound hypoglycemia. 2. Hypoglycemia--no lows since yesterday but currently on D10.  Presumed secondary to Lantus. Follow CBG off Lantus and D10 today. 3. Acute renal failure--thought secondary to diuretics, ACE (both on hold). Much improved. 4. DM--plan as above. 5. Dementia--appears stable. Continue glanatamine.  No apparent injury to right hand.  Spoke with Administrator, Civil Service at [facility], confirms history.  Left message for Roxana Hires. No answer for Mr. Belia Heman.  Code Status: Full code Family Communication: HCPOA per facility is Roxana Hires [phone]; there is a Juanetta Beets listed [phone]  Disposition Plan: [facility] assisted-living 12/13 if CBG stable  Brendia Sacks, MD  [facility] Team 6 Pager [phone] If 8PM-8AM, please contact night-coverage at [email or web address], password [facility] 02/10/2012, 10:11 AM  LOS: 1 day   Brief narrative: 24 yom with PMH of dementia and DM send form [facility] due to combative behavior upon waking up day of admission, patient woke up and started swinging. Patient hit his hand on the wall and suffered a small skin tear in the right hand. Per EMS pt cooperating on when they arrived. No BG was check at the facility. BG checked in ED it was less than 10, given several dose of D50, started on D10 and patient had food in CDU once the D10 patient had recurrent Hypoglycemia.  Consultants:  None  Procedures:  None  HPI/Subjective: Denies complaints, denies pain. Discussed with RN  Objective: Filed Vitals:   02/09/12 2031 02/09/12 2036 02/09/12 2102 02/10/12 0649  BP: 122/91  126/81 143/65  Pulse:    66  Temp:  98.2 F (36.8 C) 98.5 F (36.9 C) 97.8 F (36.6 C)  TempSrc:  Oral Oral Oral  Resp:   18 18   SpO2: 99%  96% 96%    Intake/Output Summary (Last 24 hours) at 02/10/12 1011 Last data filed at 02/10/12 0900  Gross per 24 hour  Intake    240 ml  Output    600 ml  Net   -360 ml   There were no vitals filed for this visit.  Exam:  General:  Appears calm and comfortable, sitting in chair Eyes: appear grossly normal ENT: grossly normal hearing, lips Cardiovascular: RRR, no m/r/g. No LE edema. Respiratory: CTA bilaterally, no w/r/r. Normal respiratory effort. Abdomen: soft, ntnd Skin: grossly unremarkable Musculoskeletal: grossly normal tone BUE/BLE, moves all extremities to command Psychiatric: grossly normal mood and affect, speech fluent and appropriate Neurologic: grossly non-focal.  Data Reviewed: Basic Metabolic Panel:  Lab 02/10/12 2725 02/09/12 0615  NA 142 138  K 3.5 3.3*  CL 105 106  CO2 26 23  GLUCOSE 151* 41*  BUN 20 31*  CREATININE 1.27 1.55*  CALCIUM 8.9 9.0  MG -- --  PHOS -- --   Liver Function Tests:  Lab 02/10/12 0500 02/09/12 0615  AST 28 21  ALT 14 13  ALKPHOS 64 56  BILITOT 0.8 0.5  PROT 6.2 6.0  ALBUMIN 3.3* 3.2*   CBC:  Lab 02/10/12 0500 02/09/12 0615  WBC 6.7 9.7  NEUTROABS -- --  HGB 14.3 13.9  HCT 41.0 40.2  MCV 98.6 99.8  PLT 159 154   CBG:  Lab 02/10/12 0627 02/10/12 0405 02/10/12 0215 02/10/12 0106 02/10/12 0010  GLUCAP 145* 161* 146* 156* 145*    Studies: Dg Chest 2 View  02/09/2012  *RADIOLOGY REPORT*  Clinical Data: Altered mental status  CHEST - 2 VIEW  Comparison:  Films same day  Findings: Cardiomediastinal silhouette is stable.  Slight improvement in aeration right base.  Mild residual basilar atelectasis.  No segmental infiltrate or pulmonary edema. Degenerative changes thoracic spine.  IMPRESSION: Slight improvement in aeration right base.  Mild basilar atelectasis.  No segmental infiltrate or pulmonary edema.   Original Report Authenticated By: Natasha Mead, M.D.    Ct Head Wo Contrast  02/09/2012   *RADIOLOGY REPORT*  Clinical Data: Change in mental status, dementia  CT HEAD WITHOUT CONTRAST  Technique:  Contiguous axial images were obtained from the base of the skull through the vertex without contrast.  Comparison: 08/02/2010  Findings: Atherosclerotic and physiologic intracranial calcifications.  Stable left temporal encephalomalacia with ex vacuo dilatation of the temporal horn left lateral ventricle as before.  Moderate diffuse  parenchymal atrophy. Patchy areas of hypoattenuation in deep and periventricular white matter bilaterally. Negative for acute intracranial hemorrhage, mass lesion, acute infarction, midline shift, or mass-effect. Acute infarct may be inapparent on noncontrast CT. Ventricles and sulci prominent, stable. Bone windows demonstrate no focal lesion.  IMPRESSION:  1. Negative for bleed or other acute intracranial process.  2. Atrophy and nonspecific white matter changes.   Original Report Authenticated By: D. Andria Rhein, MD    Dg Chest Portable 1 View  02/09/2012  *RADIOLOGY REPORT*  Clinical Data: Altered mental status, dementia  PORTABLE CHEST - 1 VIEW  Comparison: 08/02/2010  Findings: Cardiomegaly again noted.  No pulmonary edema.  Small amount of right basilar atelectasis or infiltrate.  Bony thorax is stable.  IMPRESSION: .  No pulmonary edema.  Small right basilar atelectasis or infiltrate.   Original Report Authenticated By: Natasha Mead, M.D.    Dg Hand Complete Right  02/09/2012  *RADIOLOGY REPORT*  Clinical Data: Laceration  RIGHT HAND - COMPLETE 3+ VIEW  Comparison: None.  Findings: Negative for fracture, dislocation, or other acute abnormality.  Normal alignment and mineralization. No significant degenerative change.  Regional soft tissues unremarkable.  IMPRESSION:  Negative   Original Report Authenticated By: D. Andria Rhein, MD     Scheduled Meds:   . [COMPLETED] sodium chloride   Intravenous Once  . aspirin EC  81 mg Oral Daily  . docusate sodium  100 mg Oral  Daily  . galantamine  8 mg Oral Q breakfast  . heparin  5,000 Units Subcutaneous Q8H  . metoprolol tartrate  50 mg Oral q morning - 10a  . metoprolol tartrate  25 mg Oral QPM  . [DISCONTINUED] galantamine  8 mg Oral Q breakfast  . [DISCONTINUED] metoprolol  25-50 mg Oral BID   Continuous Infusions:   . dextrose 10 % 1,000 mL infusion 75 mL/hr at 02/09/12 2235  . [DISCONTINUED] dextrose 10 % 1,000 mL with sodium chloride 77 mEq, potassium chloride 20 mEq infusion    . [DISCONTINUED] D-10-0.45% Sodium Chloride with KCL 40 meq/L 1000 ml 50 mL/hr at 02/09/12 0923  . [DISCONTINUED] dextrose 10 % with additives Pediatric IV fluid      Principal Problem:  *Hypoglycemia Active Problems:  Dementia  AKI (acute kidney injury)  Acute encephalopathy  DM (diabetes mellitus)     Brendia Sacks, MD  [facility] Team 6 Pager [phone] If 8PM-8AM, please contact night-coverage at [email or web address], password [facility] 02/10/2012, 10:11 AM  LOS: 1 day   Time spent: 25 minutes

## 2012-02-11 ENCOUNTER — Other Ambulatory Visit: Payer: Self-pay

## 2012-02-11 DIAGNOSIS — F0390 Unspecified dementia without behavioral disturbance: Secondary | ICD-10-CM

## 2012-02-11 LAB — BASIC METABOLIC PANEL
Calcium: 9 mg/dL (ref 8.4–10.5)
Creatinine, Ser: 1.14 mg/dL (ref 0.50–1.35)
GFR calc non Af Amer: 58 mL/min — ABNORMAL LOW (ref >90)
Sodium: 138 mEq/L (ref 135–145)

## 2012-02-11 LAB — GLUCOSE, CAPILLARY
Glucose-Capillary: 66 mg/dL — ABNORMAL LOW (ref 70–99)
Glucose-Capillary: 125 mg/dL — ABNORMAL HIGH (ref 70–99)
Glucose-Capillary: 115 mg/dL — ABNORMAL HIGH (ref 70–99)

## 2012-02-11 MED ORDER — GALANTAMINE HYDROBROMIDE 4 MG PO TABS
4.0000 mg | ORAL_TABLET | Freq: Two times a day (BID) | ORAL | Status: DC
  Administered 2012-02-11: 4 mg via ORAL
  Filled 2012-02-11 (×4): qty 1

## 2012-02-11 MED ORDER — METOPROLOL TARTRATE 50 MG PO TABS: 25.0000 mg | ORAL_TABLET | Freq: Two times a day (BID) | ORAL | Status: DC

## 2012-02-11 NOTE — Progress Notes (Signed)
[facility] PROGRESS NOTE  KYRE JEFFRIES ZOX:[id number] DOB: [birth year] DOA: 02/09/2012 PCP: Binnie Kand, MD  Assessment/Plan: 1. Acute encephalopathy--resolved. Presumed secondary to profound hypoglycemia. 2. Hypoglycemia--resolved (see below HPI, low this AM was artifact). Presumed secondary to Lantus and limited oral intake. Follow CBG stable off Lantus. No insulin on discharge. Continue CBG checks at facility. 3. Acute renal failure--resolved, thought secondary to diuretics, ACE (both on hold), likely contributed to by variable PO intake. BP controlled on metoprolol alone. Follow-up as outpatient. 4. DM--plan as above. 5. Atrial fibrillation--new diagnosis. Noted in ED and on admission EKG and repeat EKG today. Rate controlled. Not a candidate for anticoagulation secondary to dementia, fall risk. Continue ASA. Workup can be completed as an outpatient, consider 2d echo and TSH as clinically indicated. 6. HTN--stable on metoprolol alone. Will reduce dose given slow ventricular response.  7. Dementia--appears stable. Continue glanatamine.  Left message for Roxana Hires again 12/13 to notify of discharge, did not receive call back from message left 12/12. No answer for Mr. Belia Heman again 12/13.  EKG 12/13--atrial fibrilattion, SVR.  Code Status: Full code Family Communication: HCPOA per facility is Roxana Hires [phone]; there is a Juanetta Beets listed [phone]  Disposition Plan: [facility] assisted-living 12/13 if CBG stable  Brendia Sacks, MD  [facility] Team 6 Pager [phone] If 8PM-8AM, please contact night-coverage at [email or web address], password [email or web address] 02/11/2012, 1:20 PM  LOS: 2 days   Brief narrative: 37 yom with PMH of dementia and DM send form [facility] due to combative behavior upon waking up day of admission, patient woke up and started swinging. Patient hit his hand on the wall and suffered a small skin tear in the right hand. Per  EMS pt cooperating on when they arrived. No BG was check at the facility. BG checked in ED it was less than 10, given several dose of D50, started on D10 and patient had food in CDU once the D10 patient had recurrent Hypoglycemia.  Consultants:  None  Procedures:  None  HPI/Subjective: CBG noted to be 66 @0511  but BMP collected @0515  CBG 112. Received no sugar oral/IV. Subsequent CBG 111, 132, 125, 115. Patient denies complaints. RN notes did not eat much breakfast. No n/v or pain per RN.  Objective: Filed Vitals:   02/10/12 0649 02/10/12 1342 02/10/12 2253 02/11/12 0600  BP: 143/65 130/79 138/79 137/75  Pulse: 66 75 61 78  Temp: 97.8 F (36.6 C) 98 F (36.7 C) 98.6 F (37 C) 97.7 F (36.5 C)  TempSrc: Oral Oral Oral Oral  Resp: 18 20 20 20   SpO2: 96% 98% 91% 99%    Intake/Output Summary (Last 24 hours) at 02/11/12 1320 Last data filed at 02/11/12 0400  Gross per 24 hour  Intake    240 ml  Output      0 ml  Net    240 ml   There were no vitals filed for this visit.  Exam:  General:  Appears calm and comfortable Cardiovascular: RRR, no m/r/g. No LE edema. Respiratory: CTA bilaterally, no w/r/r. Normal respiratory effort. Abdomen: soft, ntnd Musculoskeletal: grossly normal tone BUE/BLE, moves all extremities to command Psychiatric: confused, speech fluent and appropriate Neurologic: grossly non-focal.  Data Reviewed: Basic Metabolic Panel:  Lab 02/11/12 4696 02/10/12 0500 02/09/12 0615  NA 138 142 138  K 4.5 3.5 3.3*  CL 102 105 106  CO2 25 26 23   GLUCOSE 112* 151* 41*  BUN 15 20 31*  CREATININE 1.14 1.27 1.55*  CALCIUM 9.0 8.9 9.0  MG -- -- --  PHOS -- -- --   Liver Function Tests:  Lab 02/10/12 0500 02/09/12 0615  AST 28 21  ALT 14 13  ALKPHOS 64 56  BILITOT 0.8 0.5  PROT 6.2 6.0  ALBUMIN 3.3* 3.2*   CBC:  Lab 02/10/12 0500 02/09/12 0615  WBC 6.7 9.7  NEUTROABS -- --  HGB 14.3 13.9  HCT 41.0 40.2  MCV 98.6 99.8  PLT 159 154    CBG:  Lab 02/11/12 1141 02/11/12 0951 02/11/12 0724 02/11/12 0549 02/11/12 0511  GLUCAP 115* 125* 132* 111* 66*    Studies: No results found.  Scheduled Meds:    . aspirin EC  81 mg Oral Daily  . docusate sodium  100 mg Oral Daily  . galantamine  4 mg Oral BID WC  . heparin  5,000 Units Subcutaneous Q8H  . metoprolol tartrate  50 mg Oral q morning - 10a  . metoprolol tartrate  25 mg Oral QPM   Continuous Infusions:   Principal Problem:  *Hypoglycemia Active Problems:  Dementia  AKI (acute kidney injury)  Acute encephalopathy  DM (diabetes mellitus)     Brendia Sacks, MD  [facility] Team 6 Pager [phone] If 8PM-8AM, please contact night-coverage at [email or web address], password [facility] 02/11/2012, 1:20 PM  LOS: 2 days

## 2012-02-11 NOTE — Discharge Summary (Signed)
Physician Discharge Summary  Kevin Dawson QMV:[id number] DOB: September 22, 1927 DOA: 02/09/2012  PCP: Binnie Kand, MD  Admit date: 02/09/2012 Discharge date: 02/11/2012  Recommendations for Outpatient Follow-up:  1. Follow-up hypoglycemia, resolved off insulin. Continue CBG checks. 2. Follow-up new diagnosis of atrial fibrillation, consider TSH, echocardiogram and further evaluation as clinically indicated. Metoprolol dose was decreased secondary to slow ventricular response. 3. Follow-up HTN. Diuretic and ACE-I were stopped secondary to acute renal failure, resolved, but blood pressure well-controlled on beta-blocker alone. (include homehealth, outpatient follow-up instructions, specific recommendations for PCP to follow-up on, etc.)  Follow-up Information    Follow up with [facility]. (If symptoms worsen)    Contact information:   [address] [id number] mc [place] [address] [phone]      Follow up with Binnie Kand, MD. Schedule an appointment as soon as possible for a visit in 3 days.        Discharge Diagnoses:  1. Acute encephalopathy 2. Hypoglycemia 3. Acute renal failure 4. DM 5. Atrial fibrillation 6. HTN 7. Dementia  Discharge Condition: improved Disposition: return to ALF  Diet recommendation: regular diet  History of present illness:  70 yom with PMH of dementia and DM send from [facility] due to combative behavior upon waking up day of admission, patient woke up and started swinging. Patient hit his hand on the wall and suffered a small skin tear in the right hand. Per EMS pt cooperating on when they arrived. No BG was check at the facility. BG checked in ED it was less than 10, given several dose of D50, started on D10 and patient had food in CDU once the D10 patient had recurrent Hypoglycemia.  Hospital Course:  Mr. Pozzi was admitted for profound hypoglycemia and acute  encephalopathy. Hypoglycemia resolved off of Lantus and acute encephalopathy resolved with correction of blood sugar. PO intake variable during admission and it is suspected that etiology of hypoglycemia was Lantus + poor intake. Has required no insulin here and blood sugars well controlled, therefore Lantus stopped. Acute renal failure presumed secondary to variable intake + diuretics and corrected with supportive care. As blood pressure was well controlled on metoprolol alone, ACE-I and diurectics have been held on discharge. New diagnosis of atrial fibrillation was made (incidental finding), asymptomatic. Further workup can be done in outpatient setting. With no further lows, patient stable to return to facility. Messages left for POA who did not return phone calls.  1. Acute encephalopathy--resolved. Presumed secondary to profound hypoglycemia.  2. Hypoglycemia--resolved. Presumed secondary to Lantus and limited oral intake. Follow CBG stable off Lantus. No insulin on discharge. Continue CBG checks at facility.  3. Acute renal failure--resolved, thought secondary to diuretics, ACE (both on hold), likely contributed to by variable PO intake. BP controlled on metoprolol alone. Follow-up as outpatient.  4. DM--plan as above.  5. Atrial fibrillation--new diagnosis. Noted in ED and on admission EKG and repeat EKG today. Rate controlled. Not a candidate for anticoagulation secondary to dementia, fall risk. Continue ASA. Workup can be completed as an outpatient, consider 2d echo and TSH as clinically indicated.  6. HTN--stable on metoprolol alone. Will reduce dose given slow ventricular response.    7. Dementia--appears stable. Continue glanatamine.  Consultants:  None  Procedures:  None  Discharge Instructions  Discharge Orders    Future Orders Please Complete By Expires   Diet general      Discharge instructions      Comments:   Monitor  food intake, continue CBG checks. Insulin discontinued  secondary to hypoglycemia. Call physician or seek immediate medical attention for fever, change in condition, hypoglycemia. Recommend fall precautions.   Increase activity slowly          Medication List     As of 02/11/2012  1:42 PM    STOP taking these medications         insulin glargine 100 UNIT/ML injection   Commonly known as: LANTUS      lisinopril 20 MG tablet   Commonly known as: PRINIVIL,ZESTRIL      triamterene-hydrochlorothiazide 37.5-25 MG per tablet   Commonly known as: MAXZIDE-25      TAKE these medications         acetaminophen 325 MG tablet   Commonly known as: TYLENOL   Take 650 mg by mouth every 6 (six) hours as needed. For pain      aspirin EC 81 MG tablet   Take 81 mg by mouth daily.      DAILY VITE PO   Take 1 tablet by mouth daily.      docusate sodium 100 MG capsule   Commonly known as: COLACE   Take 100 mg by mouth daily.      galantamine 8 MG 24 hr capsule   Commonly known as: RAZADYNE ER   Take 8 mg by mouth daily with breakfast.      loperamide 2 MG capsule   Commonly known as: IMODIUM   Take 2 mg by mouth 4 (four) times daily as needed. For loose stool      metoprolol 50 MG tablet   Commonly known as: LOPRESSOR   Take 0.5 tablets (25 mg total) by mouth 2 (two) times daily.      PX B COMPLEX/VITAMIN C PO   Take 1 tablet by mouth daily.      ranitidine 150 MG tablet   Commonly known as: ZANTAC   Take 150 mg by mouth at bedtime.       The results of significant diagnostics from this hospitalization (including imaging, microbiology, ancillary and laboratory) are listed below for reference.    Significant Diagnostic Studies: Dg Chest 2 View  02/09/2012  *RADIOLOGY REPORT*  Clinical Data: Altered mental status  CHEST - 2 VIEW  Comparison:  Films same day  Findings: Cardiomediastinal silhouette is stable.  Slight improvement in aeration right base.  Mild residual basilar atelectasis.  No segmental infiltrate or pulmonary edema.  Degenerative changes thoracic spine.  IMPRESSION: Slight improvement in aeration right base.  Mild basilar atelectasis.  No segmental infiltrate or pulmonary edema.   Original Report Authenticated By: Natasha Mead, M.D.    Ct Head Wo Contrast  02/09/2012  *RADIOLOGY REPORT*  Clinical Data: Change in mental status, dementia  CT HEAD WITHOUT CONTRAST  Technique:  Contiguous axial images were obtained from the base of the skull through the vertex without contrast.  Comparison: 08/02/2010  Findings: Atherosclerotic and physiologic intracranial calcifications.  Stable left temporal encephalomalacia with ex vacuo dilatation of the temporal horn left lateral ventricle as before.  Moderate diffuse  parenchymal atrophy. Patchy areas of hypoattenuation in deep and periventricular white matter bilaterally. Negative for acute intracranial hemorrhage, mass lesion, acute infarction, midline shift, or mass-effect. Acute infarct may be inapparent on noncontrast CT. Ventricles and sulci prominent, stable. Bone windows demonstrate no focal lesion.  IMPRESSION:  1. Negative for bleed or other acute intracranial process.  2. Atrophy and nonspecific white matter changes.   Original Report Authenticated  By: D. Andria Rhein, MD    Dg Chest Portable 1 View  02/09/2012  *RADIOLOGY REPORT*  Clinical Data: Altered mental status, dementia  PORTABLE CHEST - 1 VIEW  Comparison: 08/02/2010  Findings: Cardiomegaly again noted.  No pulmonary edema.  Small amount of right basilar atelectasis or infiltrate.  Bony thorax is stable.  IMPRESSION: .  No pulmonary edema.  Small right basilar atelectasis or infiltrate.   Original Report Authenticated By: Natasha Mead, M.D.    Dg Hand Complete Right  02/09/2012  *RADIOLOGY REPORT*  Clinical Data: Laceration  RIGHT HAND - COMPLETE 3+ VIEW  Comparison: None.  Findings: Negative for fracture, dislocation, or other acute abnormality.  Normal alignment and mineralization. No significant degenerative change.   Regional soft tissues unremarkable.  IMPRESSION:  Negative   Original Report Authenticated By: D. Andria Rhein, MD     Labs: Basic Metabolic Panel:  Lab 02/11/12 5784 02/10/12 0500 02/09/12 0615  NA 138 142 138  K 4.5 3.5 3.3*  CL 102 105 106  CO2 25 26 23   GLUCOSE 112* 151* 41*  BUN 15 20 31*  CREATININE 1.14 1.27 1.55*  CALCIUM 9.0 8.9 9.0  MG -- -- --  PHOS -- -- --   Liver Function Tests:  Lab 02/10/12 0500 02/09/12 0615  AST 28 21  ALT 14 13  ALKPHOS 64 56  BILITOT 0.8 0.5  PROT 6.2 6.0  ALBUMIN 3.3* 3.2*   CBC:  Lab 02/10/12 0500 02/09/12 0615  WBC 6.7 9.7  NEUTROABS -- --  HGB 14.3 13.9  HCT 41.0 40.2  MCV 98.6 99.8  PLT 159 154   CBG:  Lab 02/11/12 1141 02/11/12 0951 02/11/12 0724 02/11/12 0549 02/11/12 0511  GLUCAP 115* 125* 132* 111* 66*    Principal Problem:  *Hypoglycemia Active Problems:  Dementia  AKI (acute kidney injury)  Acute encephalopathy  DM (diabetes mellitus)   Time coordinating discharge: 40 minutes  Signed:  Brendia Sacks, MD [facility] 02/11/2012, 1:42 PM

## 2012-02-11 NOTE — Progress Notes (Signed)
Report called to [place] at [facility].

## 2012-02-11 NOTE — Progress Notes (Signed)
Hypoglycemic Event  CBG: 66    Treatment: apple sauce and orange juice   Symptoms: hard to assess due to underlying dementia,  Lethargy is suspected. Pt arousable by voice and touch.    Follow-up CBG: Time:  0545   CBG Result: 111  Possible Reasons for Event: low PO intake   Comments/MD notified:  MD not notified at this time. Will pass on in AM report.    Charm Stenner J  Remember to initiate Hypoglycemia Order Set & complete

## 2013-05-07 ENCOUNTER — Other Ambulatory Visit (HOSPITAL_COMMUNITY): Payer: Self-pay | Admitting: Geriatric Medicine

## 2013-05-07 DIAGNOSIS — R1310 Dysphagia, unspecified: Secondary | ICD-10-CM

## 2013-05-15 ENCOUNTER — Ambulatory Visit (HOSPITAL_COMMUNITY)
Admission: RE | Admit: 2013-05-15 | Discharge: 2013-05-15 | Disposition: A | Discharge status: Home / Self Care | Payer: Medicare Other | Source: Ambulatory Visit | Attending: Geriatric Medicine | Admitting: Geriatric Medicine

## 2013-05-15 DIAGNOSIS — R1310 Dysphagia, unspecified: Secondary | ICD-10-CM

## 2013-05-15 NOTE — Procedures (Signed)
Objective Swallowing Evaluation: Modified Barium Swallowing Study  Patient Details  Name: Kevin Dawson MRN: [id number] Date of Birth: 06-14-1927  Today's Date: 05/15/2013 Time: 4098-1191 SLP Time Calculation (min): 30 min  Past Medical History:  Past Medical History  Diagnosis Date  . Dementia   . Anemia   . Diabetes mellitus without complication   . Alcoholic psychosis   . Alcohol abuse   . Subtrochanteric fracture    Past Surgical History: No past surgical history on file. HPI:  80 yo male referred for MBS to assess swallow ability.  Pt current diet at facility appears to be pureed due to edentulous status per chart review.  Pt has h/o hypoglycemia, HTN, acute encephalopathy, DM, afib, acute renal failure, etoh use and dementia.       Assessment / Plan / Recommendation Clinical Impression  Dysphagia Diagnosis: Mild oral phase dysphagia;Mild pharyngeal phase dysphagia  Clinical impression: Mild oropharyngeal dysphagia without aspiration or deep frank penetration of any consistency tested.  Pt takes very large sequential boluses but adequately protected his airway with swallows.  Delayed oral transiting noted due to weakness.  Pt unable to orally transit barium tablet with pudding with two attempts.   Minimal amount of oral tongue base and vallecular stasis noted at end of MBS that pt did not dry swallow with cue to clear - bolus of water aided clearance.    Defer to referring healthcare provided for solid food recommendations but pt able to adequately "masticate" a whole graham cracker.  Thin liquids were not aspirated or penetrated.  Suspect pt's impulsivity and decreased ability to follow directions contribute to his aspiration risk.     Treatment Recommendation   defer to referring healthcare provider    Diet Recommendation Thin liquid (puree vs ground vs soft - defer to referring healthcare provider)   Liquid Administration via: Cup;Straw Medication Administration: Crushed  with puree Supervision: Patient able to self feed;Full supervision/cueing for compensatory strategies Compensations: Slow rate;Small sips/bites Postural Changes and/or Swallow Maneuvers: Seated upright 90 degrees;Upright 30-60 min after meal    Other  Recommendations Oral Care Recommendations: Oral care BID              General Date of Onset: 05/15/13 HPI: 80 yo male referred for MBS to assess swallow ability.  Pt current diet at facility appears to be pureed due to edentulous status per chart review.  Pt has h/o hypoglycemia, HTN, acute encephalopathy, DM, afib, acute renal failure, and dementia.   Type of Study: Modified Barium Swallowing Study Reason for Referral: Objectively evaluate swallowing function Diet Prior to this Study: Dysphagia 1 (puree);Thin liquids Temperature Spikes Noted: No Respiratory Status: Room air History of Recent Intubation: No Behavior/Cognition: Alert;Doesn't follow directions;Decreased sustained attention (followed some directions-inconsistent) Oral Cavity - Dentition: Edentulous Oral Motor / Sensory Function: Within functional limits Self-Feeding Abilities: Able to feed self Patient Positioning: Upright in chair Baseline Vocal Quality: Clear Volitional Cough: Cognitively unable to elicit Volitional Swallow: Unable to elicit (pt did not conduct dry swallow) Anatomy: Within functional limits Pharyngeal Secretions: Not observed secondary MBS    Reason for Referral Objectively evaluate swallowing function   Oral Phase Oral Preparation/Oral Phase Oral Phase: Impaired Oral - Nectar Oral - Nectar Cup: Weak lingual manipulation;Delayed oral transit;Reduced posterior propulsion Oral - Thin Oral - Thin Cup: Weak lingual manipulation;Delayed oral transit;Reduced posterior propulsion Oral - Thin Straw: Weak lingual manipulation;Delayed oral transit;Reduced posterior propulsion Oral - Solids Oral - Puree: Weak lingual manipulation;Delayed oral transit;Reduced  posterior propulsion  Oral - Regular: Weak lingual manipulation;Delayed oral transit;Reduced posterior propulsion Oral - Pill: Weak lingual manipulation;Delayed oral transit;Reduced posterior propulsion (pt unable to orally transit tablet with pudding during 2 attempts, he expectorated it with SLP permission) Oral Phase - Comment Oral Phase - Comment: pt is impulsive and takes very large boluses - he did not follow directions to take small single sips   Pharyngeal Phase Pharyngeal Phase Pharyngeal Phase: Impaired Pharyngeal - Nectar Pharyngeal - Nectar Cup: Reduced tongue base retraction;Pharyngeal residue - valleculae Pharyngeal - Thin Pharyngeal - Thin Teaspoon: Not tested Pharyngeal - Thin Cup: Reduced tongue base retraction;Pharyngeal residue - valleculae;Pharyngeal residue - pyriform sinuses Pharyngeal - Thin Straw: Pharyngeal residue - valleculae;Pharyngeal residue - pyriform sinuses;Reduced pharyngeal peristalsis Pharyngeal - Solids Pharyngeal - Puree: Premature spillage to valleculae;Pharyngeal residue - valleculae;Reduced tongue base retraction;Reduced pharyngeal peristalsis Pharyngeal - Regular: Premature spillage to valleculae Pharyngeal Phase - Comment Pharyngeal Comment: pt impulsive and takes large sequential boluses, intermittent reflexive swallows help to decrease pharyngeal stasis, pt did not swallow on command to clear minimal amount of tongue base and vallecular residuals  Cervical Esophageal Phase    GO    Cervical Esophageal Phase Cervical Esophageal Phase: WFL (esophageal sweep x3 revealed appearance of very timely clearance)    Functional Assessment Tool Used: mbs, clinical judgement Functional Limitations: Swallowing Swallow Current Status ([id number]): At least 1 percent but less than 20 percent impaired, limited or restricted Swallow Goal Status [phone]): At least 1 percent but less than 20 percent impaired, limited or restricted Swallow Discharge Status [phone]([id number]):  At least 1 percent but less than 20 percent impaired, limited or restricted    Donavan Burnetamara Caya Soberanis, MS [facility] SLP [phone]

## 2013-09-24 ENCOUNTER — Other Ambulatory Visit (HOSPITAL_COMMUNITY): Payer: Self-pay | Admitting: *Deleted

## 2013-09-24 DIAGNOSIS — I517 Cardiomegaly: Secondary | ICD-10-CM

## 2013-10-02 ENCOUNTER — Ambulatory Visit (HOSPITAL_COMMUNITY)
Admission: RE | Admit: 2013-10-02 | Discharge: 2013-10-02 | Disposition: A | Discharge status: Home / Self Care | Payer: Medicare Other | Source: Ambulatory Visit | Attending: Internal Medicine | Admitting: Internal Medicine

## 2013-10-02 DIAGNOSIS — I517 Cardiomegaly: Secondary | ICD-10-CM | POA: Diagnosis present

## 2013-10-02 DIAGNOSIS — I359 Nonrheumatic aortic valve disorder, unspecified: Secondary | ICD-10-CM

## 2013-10-02 DIAGNOSIS — R011 Cardiac murmur, unspecified: Secondary | ICD-10-CM

## 2013-10-02 NOTE — Progress Notes (Signed)
2D Echo Performed 10/02/2013    Chalisa Kobler, RCS  

## 2013-10-03 ENCOUNTER — Telehealth (HOSPITAL_COMMUNITY): Payer: Self-pay | Admitting: *Deleted

## 2014-01-08 IMAGING — CR DG CHEST 1V PORT
1 series · 1 of 1 positions shown · non-contrast
Comparison: 08/02/2010

CLINICAL DATA: Altered mental status, dementia

PORTABLE CHEST - 1 VIEW

[x chest ap]
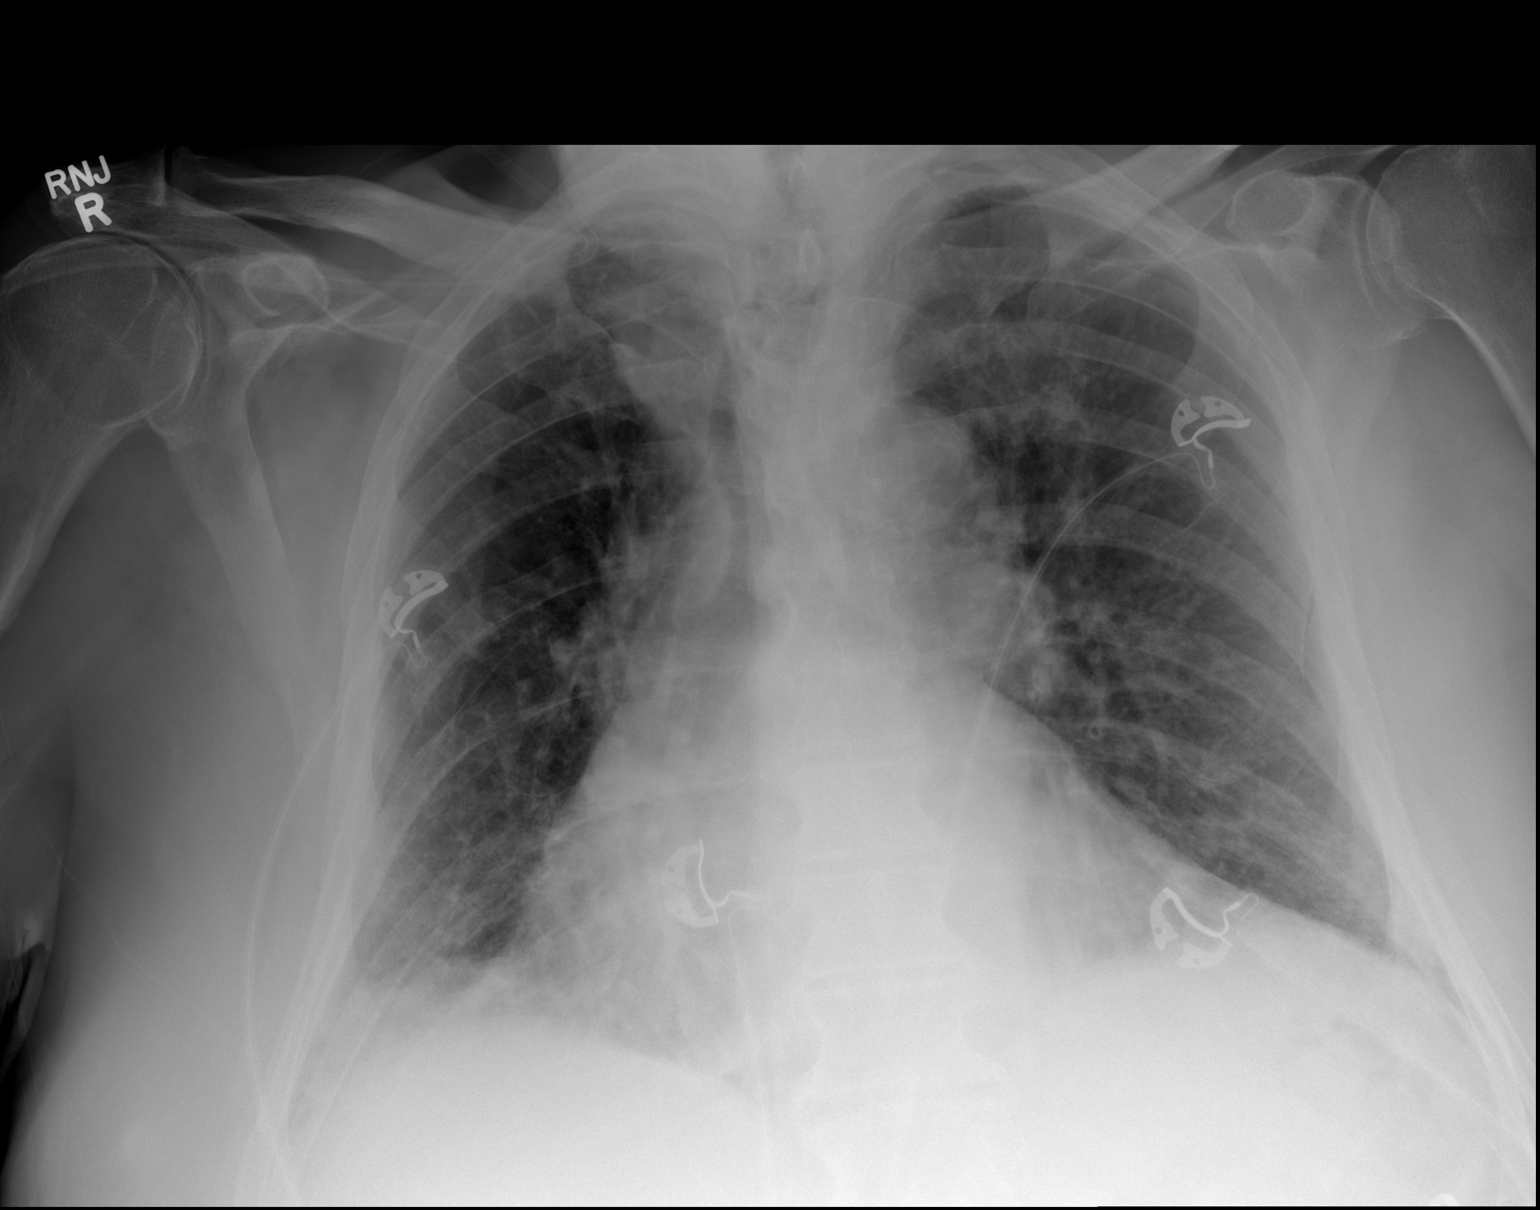

[1 of 1 positions shown; findings below may reference images not displayed]

FINDINGS: Cardiomegaly again noted.  No pulmonary edema.  Small
amount of right basilar atelectasis or infiltrate.  Bony thorax is
stable.
IMPRESSION: .  No pulmonary edema.  Small right basilar atelectasis or
infiltrate.

## 2014-01-08 IMAGING — CT CT HEAD W/O CM
2 series · 16 of 30 positions shown, 20 images · non-contrast
Comparison: 08/02/2010

CLINICAL DATA: Change in mental status, dementia

CT HEAD WITHOUT CONTRAST
TECHNIQUE: Contiguous axial images were obtained from the base of
the skull through the vertex without contrast.

[Series 2: head w/o · axial · non-contrast · 0.43mm/px · z∈[+716,+836]mm · 13 of 30 slices shown, 17 images]
[im 3/30  brain]
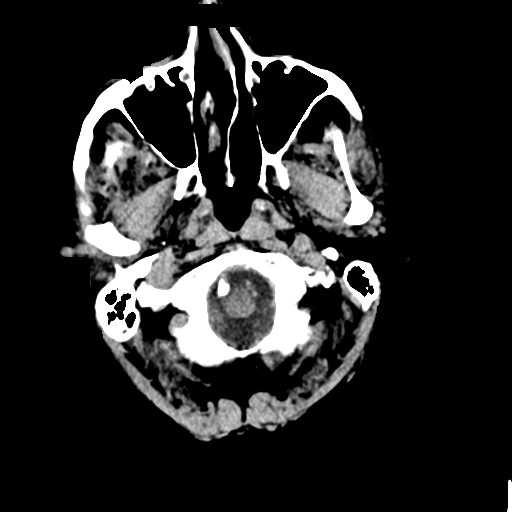
[im 3/30  bone]
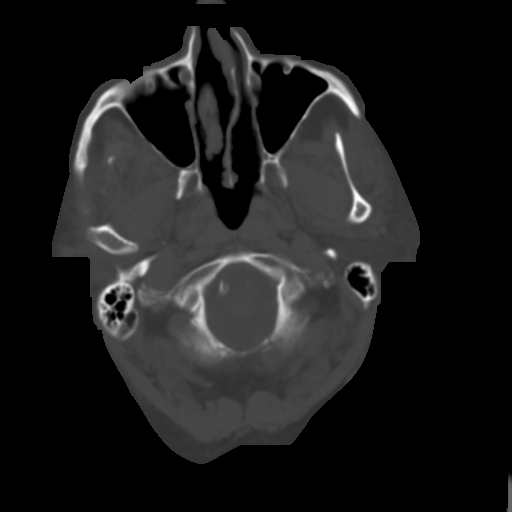
[im 5/30  brain]
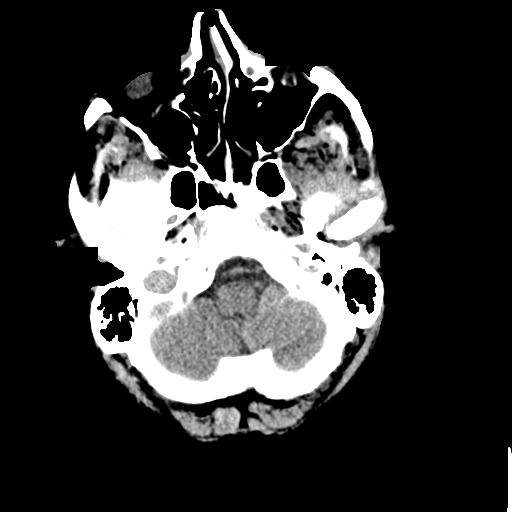
[im 7/30  brain]
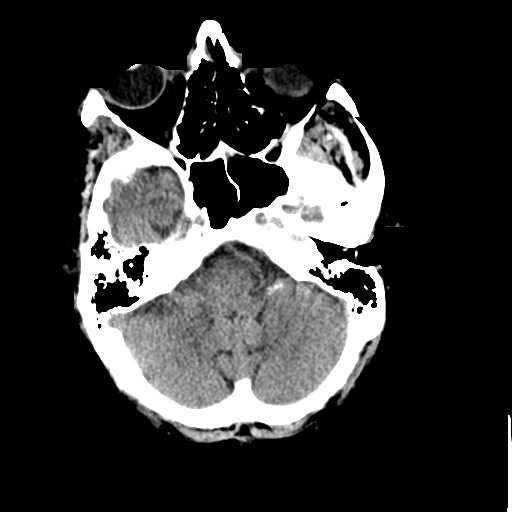
[im 9/30  brain]
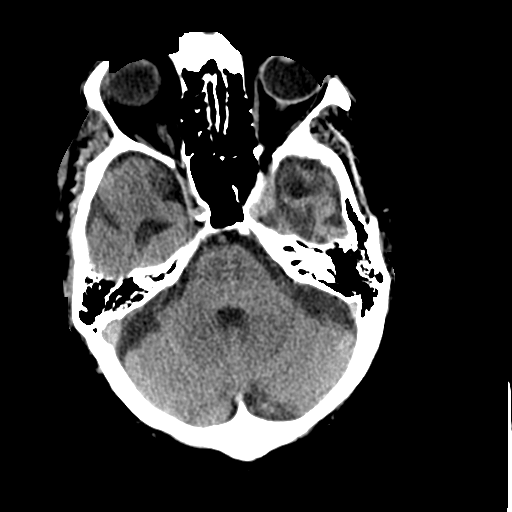
[im 11/30  brain]
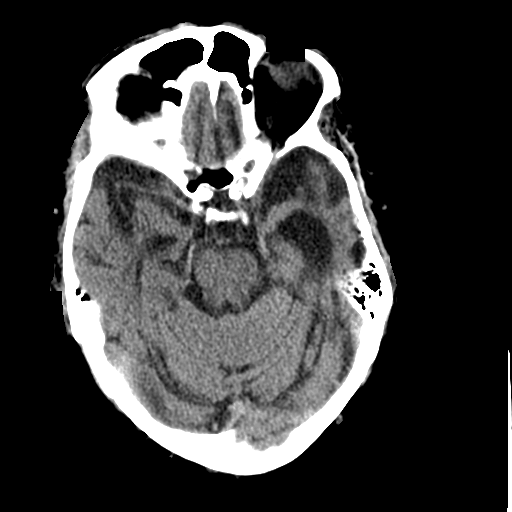
[im 11/30  bone]
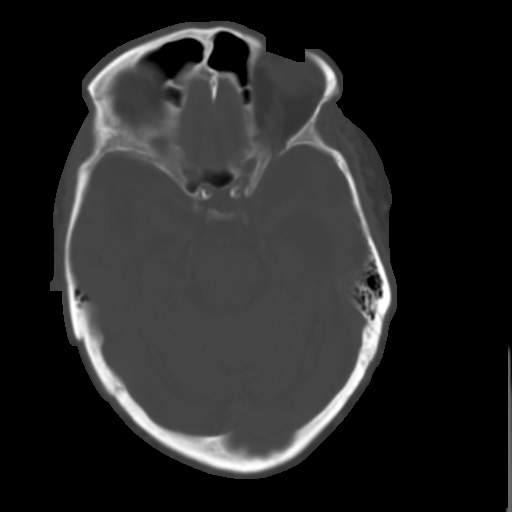
[im 13/30  brain]
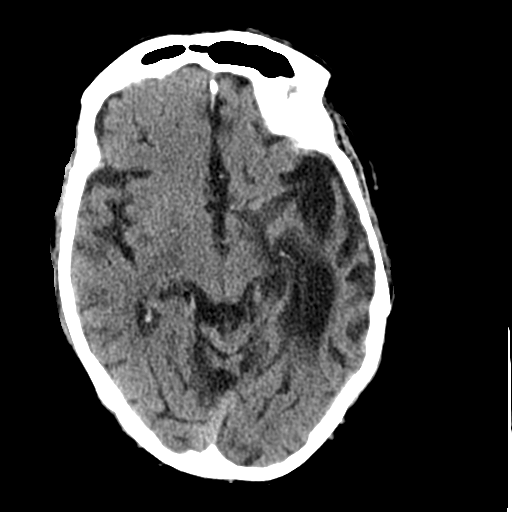
[im 15/30  brain]
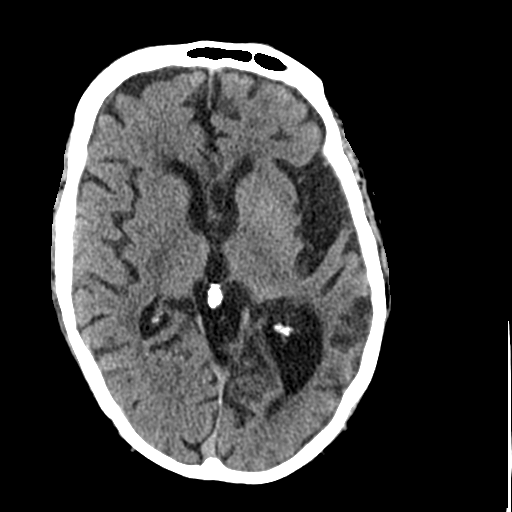
[im 17/30  brain]
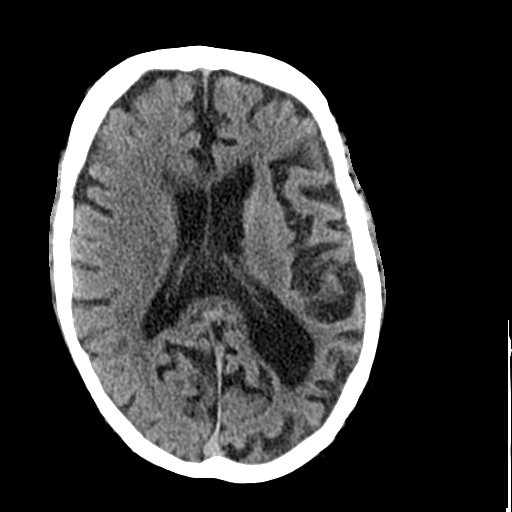
[im 19/30  brain]
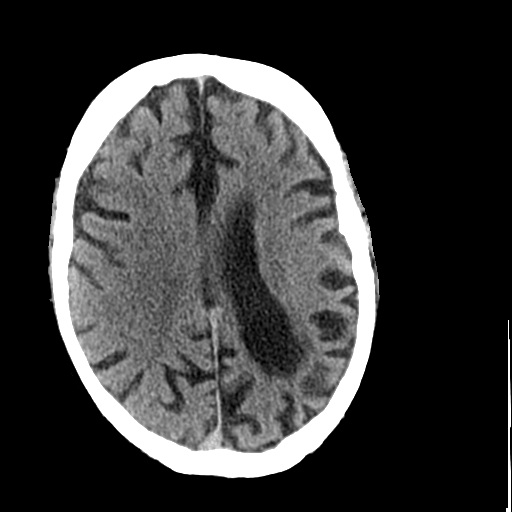
[im 19/30  bone]
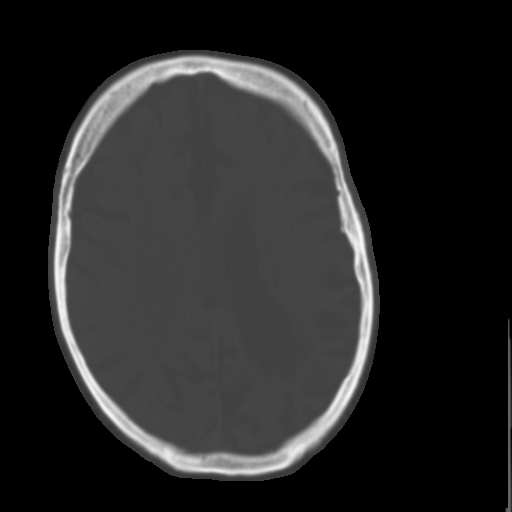
[im 21/30  brain]
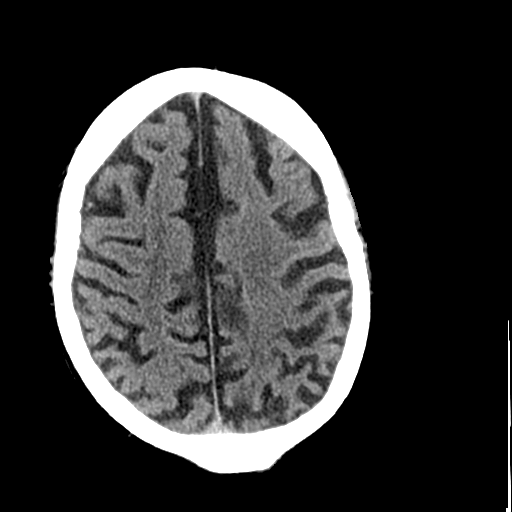
[im 23/30  brain]
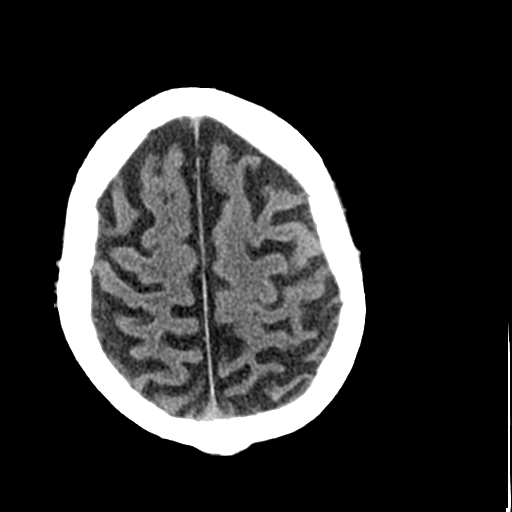
[im 25/30  brain]
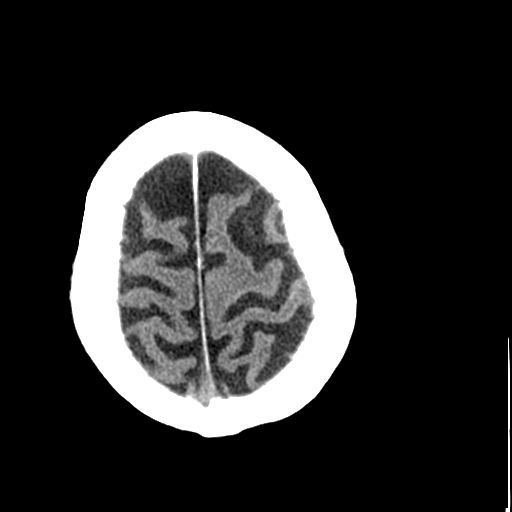
[im 27/30  brain]
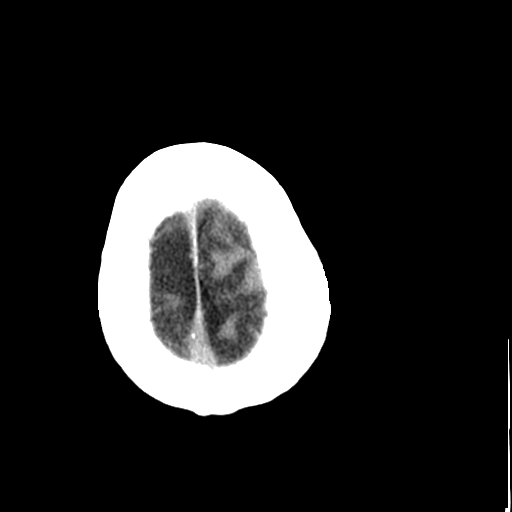
[im 27/30  bone]
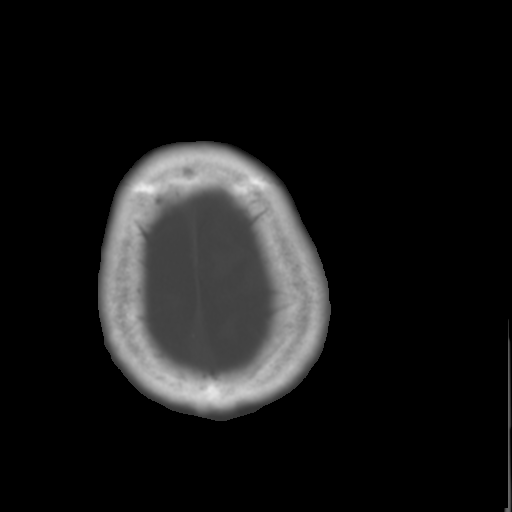

[Series 3: bone windows · axial · 0.43mm/px · z∈[+716,+756]mm · 3 of 30 slices shown]
[im 3/30  bone]
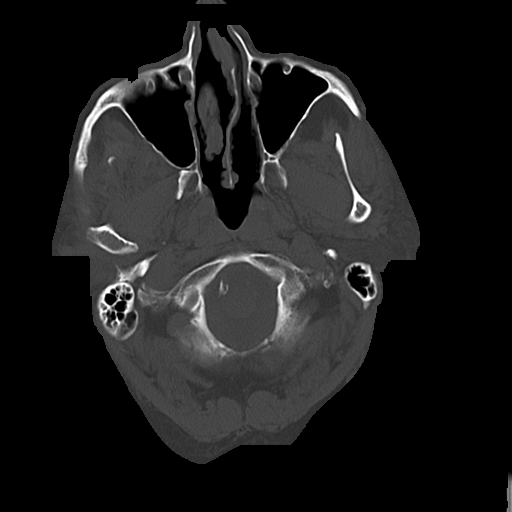
[im 7/30  bone]
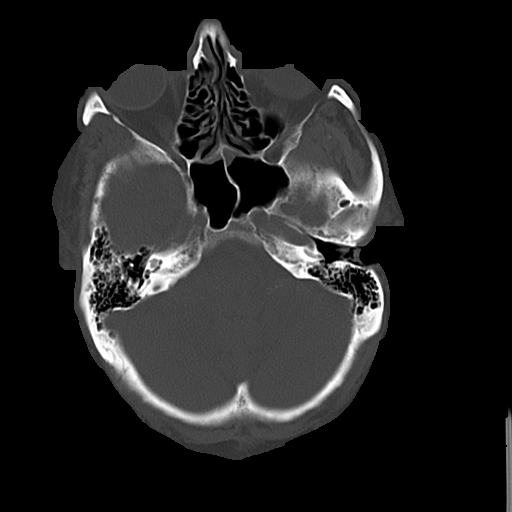
[im 11/30  bone]
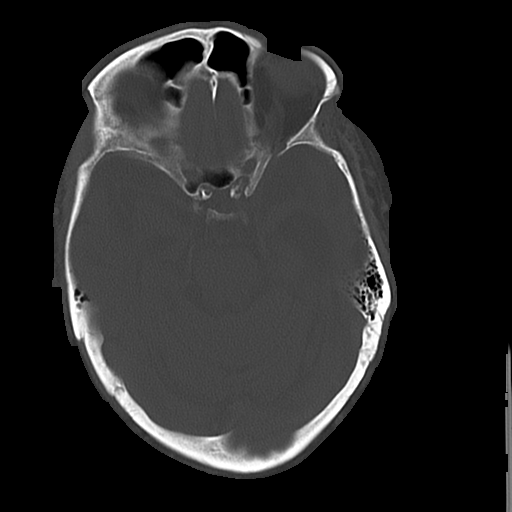

[16 of 30 positions shown; findings below may reference images not displayed]

FINDINGS: Atherosclerotic and physiologic intracranial
calcifications.  Stable left temporal encephalomalacia with ex
vacuo dilatation of the temporal horn left lateral ventricle as
before.  Moderate diffuse  parenchymal atrophy. Patchy areas of
hypoattenuation in deep and periventricular white matter
bilaterally. Negative for acute intracranial hemorrhage, mass
lesion, acute infarction, midline shift, or mass-effect. Acute
infarct may be inapparent on noncontrast CT. Ventricles and sulci
prominent, stable. Bone windows demonstrate no focal lesion.
IMPRESSION: 1. Negative for bleed or other acute intracranial process.

2. Atrophy and nonspecific white matter changes.

## 2014-03-23 ENCOUNTER — Emergency Department (HOSPITAL_COMMUNITY): Payer: Medicare Other

## 2014-03-23 ENCOUNTER — Emergency Department (HOSPITAL_COMMUNITY)
Admission: EM | Admit: 2014-03-23 | Discharge: 2014-03-23 | Disposition: A | Discharge status: Home / Self Care | Payer: Medicare Other | Attending: Emergency Medicine | Admitting: Emergency Medicine

## 2014-03-23 DIAGNOSIS — D72829 Elevated white blood cell count, unspecified: Secondary | ICD-10-CM | POA: Diagnosis not present

## 2014-03-23 DIAGNOSIS — Z79899 Other long term (current) drug therapy: Secondary | ICD-10-CM | POA: Insufficient documentation

## 2014-03-23 DIAGNOSIS — E119 Type 2 diabetes mellitus without complications: Secondary | ICD-10-CM | POA: Insufficient documentation

## 2014-03-23 DIAGNOSIS — Z862 Personal history of diseases of the blood and blood-forming organs and certain disorders involving the immune mechanism: Secondary | ICD-10-CM | POA: Insufficient documentation

## 2014-03-23 DIAGNOSIS — F0390 Unspecified dementia without behavioral disturbance: Secondary | ICD-10-CM | POA: Insufficient documentation

## 2014-03-23 DIAGNOSIS — R509 Fever, unspecified: Secondary | ICD-10-CM | POA: Diagnosis present

## 2014-03-23 DIAGNOSIS — Z8781 Personal history of (healed) traumatic fracture: Secondary | ICD-10-CM | POA: Insufficient documentation

## 2014-03-23 DIAGNOSIS — Z7982 Long term (current) use of aspirin: Secondary | ICD-10-CM | POA: Diagnosis not present

## 2014-03-23 LAB — CBC WITH DIFFERENTIAL/PLATELET
BASOS ABS: 0 10*3/uL (ref 0.0–0.1)
BASOS PCT: 0 % (ref 0–1)
Eosinophils Absolute: 0 10*3/uL (ref 0.0–0.7)
Eosinophils Relative: 0 % (ref 0–5)
HEMATOCRIT: 43.2 % (ref 39.0–52.0)
Hemoglobin: 14.9 g/dL (ref 13.0–17.0)
Lymphocytes Relative: 4 % — ABNORMAL LOW (ref 12–46)
Lymphs Abs: 0.6 10*3/uL — ABNORMAL LOW (ref 0.7–4.0)
MCH: 33.7 pg (ref 26.0–34.0)
MCHC: 34.5 g/dL (ref 30.0–36.0)
MCV: 97.7 fL (ref 78.0–100.0)
MONO ABS: 0.8 10*3/uL (ref 0.1–1.0)
Monocytes Relative: 5 % (ref 3–12)
Neutro Abs: 14.5 10*3/uL — ABNORMAL HIGH (ref 1.7–7.7)
Neutrophils Relative %: 91 % — ABNORMAL HIGH (ref 43–77)
Platelets: 167 10*3/uL (ref 150–400)
RBC: 4.42 MIL/uL (ref 4.22–5.81)
RDW: 13.1 % (ref 11.5–15.5)
WBC: 15.9 10*3/uL — ABNORMAL HIGH (ref 4.0–10.5)

## 2014-03-23 LAB — BASIC METABOLIC PANEL
Anion gap: 11 (ref 5–15)
BUN: 20 mg/dL (ref 6–23)
CALCIUM: 9.1 mg/dL (ref 8.4–10.5)
CO2: 24 mmol/L (ref 19–32)
Chloride: 102 mmol/L (ref 96–112)
Creatinine, Ser: 1.27 mg/dL (ref 0.50–1.35)
GFR calc Af Amer: 58 mL/min — ABNORMAL LOW (ref >90)
GFR calc non Af Amer: 50 mL/min — ABNORMAL LOW (ref >90)
Glucose, Bld: 148 mg/dL — ABNORMAL HIGH (ref 70–99)
Potassium: 3.7 mmol/L (ref 3.5–5.1)
Sodium: 137 mmol/L (ref 135–145)

## 2014-03-23 LAB — URINALYSIS, ROUTINE W REFLEX MICROSCOPIC
Bilirubin Urine: NEGATIVE
Glucose, UA: NEGATIVE mg/dL
Ketones, ur: NEGATIVE mg/dL
LEUKOCYTES UA: NEGATIVE
Nitrite: NEGATIVE
PH: 6.5 (ref 5.0–8.0)
PROTEIN: NEGATIVE mg/dL
SPECIFIC GRAVITY, URINE: 1.019 (ref 1.005–1.030)
UROBILINOGEN UA: 1 mg/dL (ref 0.0–1.0)

## 2014-03-23 LAB — CBG MONITORING, ED: GLUCOSE-CAPILLARY: 132 mg/dL — AB (ref 70–99)

## 2014-03-23 LAB — URINE MICROSCOPIC-ADD ON

## 2014-03-23 MED ORDER — ACETAMINOPHEN 325 MG PO TABS
650.0000 mg | ORAL_TABLET | Freq: Once | ORAL | Status: DC
  Filled 2014-03-23: qty 2

## 2014-03-23 MED ORDER — IBUPROFEN 200 MG PO TABS
600.0000 mg | ORAL_TABLET | Freq: Once | ORAL | Status: AC
  Administered 2014-03-23: 600 mg via ORAL
  Filled 2014-03-23: qty 3

## 2014-03-23 MED ORDER — ACETAMINOPHEN 650 MG RE SUPP
650.0000 mg | Freq: Once | RECTAL | Status: AC
  Administered 2014-03-23: 650 mg via RECTAL
  Filled 2014-03-23: qty 1

## 2014-03-23 MED ORDER — ACETAMINOPHEN 325 MG RE SUPP: 325.0000 mg | Freq: Four times a day (QID) | RECTAL | Status: DC | PRN

## 2014-03-23 NOTE — ED Provider Notes (Signed)
  Face-to-face evaluation   History: Patient with dementia from care facility, sent here for evaluation of episode of chest pain and reported hypoxia.  Physical exam: Alert, elderly man in no apparent distress.  Oxygen saturation 99% on room air.  There is no respiratory distress.  Heart regular rate and rhythm, no murmur.  Lungs clear to auscultation.     EKG Interpretation  Date/Time:  Saturday March 23 2014 12:35:05 EST Ventricular Rate:  68 PR Interval:  139 QRS Duration: 78 QT Interval:  405 QTC Calculation: 431 R Axis:   24 Text Interpretation:  Sinus or ectopic atrial rhythm Low voltage, precordial leads Abnormal R-wave progression, early transition Artifact since last tracing no significant change Confirmed by Effie ShyWENTZ  MD, Mechele CollinELLIOTT ([id number]) on 03/23/2014 12:46:04 PM       Medical screening examination/treatment/procedure(s) were conducted as a shared visit with non-physician practitioner(s) and myself.  I personally evaluated the patient during the encounter  Flint MelterElliott L Deandrea Vanpelt, MD 03/24/14 [phone]

## 2014-03-23 NOTE — ED Notes (Addendum)
Paramedics reports that staff stated that Pt. Having chest pain, sob with hypoxia and ALOC which started 45 minutes ago.Paramedics reports that staff also verbalized prior to leaving that this is the pt.s norm and he has dementia and alcoholic psychosis.  Pt. Is alert and oriented to his name but not to time or place.   He does follow commands and is very happy. Pt. Does not appear to be in any discomfort or distress. Pt. 's skin is pink , warm and dry. [facility], he is ambulatory with walker. Staff gave pt. Asa 324 mg po

## 2014-03-23 NOTE — ED Notes (Signed)
Pt. Dressed into his clothes. Pt. Given diet gingerale and applesauce

## 2014-03-23 NOTE — ED Provider Notes (Signed)
CSN: [id number]     Arrival date & time 03/23/14  1226 History   First MD Initiated Contact with Patient 03/23/14 1239     Chief Complaint  Patient presents with  . Dementia   Level V Caveat: Dementia  (Consider location/radiation/quality/duration/timing/severity/associated sxs/prior Treatment) HPI Kevin Dawson is a 81 y.o. male with a history of dementia and alcoholic psychosis who is here from [facility] for evaluation of reported hypoxia. Staff at nursing facility were concerned that, approximately 45 minutes ago, the patient's fingers looked blue. They did not report any other associated symptoms. Nothing was tried to improve his symptoms. They did note that he was acting at his baseline, which is happy, alert to person, but not to place or time and is confused.  Past Medical History  Diagnosis Date  . Dementia   . Anemia   . Diabetes mellitus without complication   . Alcoholic psychosis   . Alcohol abuse   . Subtrochanteric fracture    No past surgical history on file. Family History  Problem Relation Age of Onset  . Other Mother   . Other Father    History  Substance Use Topics  . Smoking status: Not on file  . Smokeless tobacco: Not on file  . Alcohol Use: No    Review of Systems  Unable to perform ROS     Allergies  Review of patient's allergies indicates no known allergies.  Home Medications   Prior to Admission medications   Medication Sig Start Date End Date Taking? Authorizing Provider  acetaminophen (TYLENOL) 325 MG tablet Take 650 mg by mouth every 6 (six) hours as needed. For pain   Yes Historical Provider, MD  Artificial Tear Ointment (ARTIFICIAL TEARS) ointment Place 1 application into both eyes at bedtime.   Yes Historical Provider, MD  aspirin EC 81 MG tablet Take 81 mg by mouth daily.   Yes Historical Provider, MD  B Complex-C-Folic Acid (PX B COMPLEX/VITAMIN C PO) Take 1 tablet by mouth daily.   Yes Historical Provider, MD   docusate sodium (COLACE) 100 MG capsule Take 100 mg by mouth daily.   Yes Historical Provider, MD  galantamine (RAZADYNE ER) 8 MG 24 hr capsule Take 8 mg by mouth daily with breakfast.   Yes Historical Provider, MD  Ketotifen Fumarate (THERA TEARS ALLERGY OP) Apply 1 drop to eye 4 (four) times daily.   Yes Historical Provider, MD  loperamide (IMODIUM) 2 MG capsule Take 2 mg by mouth 4 (four) times daily as needed. For loose stool   Yes Historical Provider, MD  metoprolol (LOPRESSOR) 50 MG tablet Take 0.5 tablets (25 mg total) by mouth 2 (two) times daily. 02/11/12  Yes Standley Brookinganiel P Goodrich, MD  Multiple Vitamin (DAILY VITE PO) Take 1 tablet by mouth daily.   Yes Historical Provider, MD  nystatin (MYCOSTATIN/NYSTOP) 100000 UNIT/GM POWD Apply 1 Bottle topically 3 (three) times daily. Apply to abdomen folds until resolved   Yes Historical Provider, MD  polyethylene glycol (MIRALAX / GLYCOLAX) packet Take 17 g by mouth daily.   Yes Historical Provider, MD  ranitidine (ZANTAC) 150 MG tablet Take 150 mg by mouth at bedtime.   Yes Historical Provider, MD   BP 139/83 mmHg  Pulse 54  Temp(Src) 101.8 F (38.8 C) (Rectal)  Resp 21  SpO2 100% Physical Exam  Constitutional: He is oriented to person, place, and time. He appears well-developed and well-nourished.  HENT:  Head: Normocephalic and atraumatic.  Mouth/Throat: Oropharynx is  clear and moist.  Eyes: Conjunctivae are normal. Pupils are equal, round, and reactive to light. Right eye exhibits no discharge. Left eye exhibits no discharge. No scleral icterus.  Neck: Neck supple.  Cardiovascular: Normal rate, regular rhythm and normal heart sounds.   Pulmonary/Chest: Effort normal and breath sounds normal. No respiratory distress. He has no wheezes. He has no rales.  No tachypnea on exam. Patient is 100% on room air  Abdominal: Soft. There is no tenderness.  Musculoskeletal: He exhibits no tenderness.  Neurological: He is alert and oriented to person,  place, and time.  Cranial Nerves II-XII grossly intact. Patient is acting at his baseline per reports from nursing facility.  Skin: Skin is warm and dry. No rash noted.  Psychiatric: He has a normal mood and affect.  Nursing note and vitals reviewed.   ED Course  Procedures (including critical care time) Labs Review Labs Reviewed  BASIC METABOLIC PANEL - Abnormal; Notable for the following:    Glucose, Bld 148 (*)    GFR calc non Af Amer 50 (*)    GFR calc Af Amer 58 (*)    All other components within normal limits  CBC WITH DIFFERENTIAL/PLATELET - Abnormal; Notable for the following:    WBC 15.9 (*)    Neutrophils Relative % 91 (*)    Neutro Abs 14.5 (*)    Lymphocytes Relative 4 (*)    Lymphs Abs 0.6 (*)    All other components within normal limits  URINALYSIS, ROUTINE W REFLEX MICROSCOPIC - Abnormal; Notable for the following:    Hgb urine dipstick MODERATE (*)    All other components within normal limits  URINE MICROSCOPIC-ADD ON - Abnormal; Notable for the following:    Squamous Epithelial / LPF FEW (*)    All other components within normal limits  CBG MONITORING, ED - Abnormal; Notable for the following:    Glucose-Capillary 132 (*)    All other components within normal limits  URINE CULTURE    Imaging Review Dg Chest 2 View  03/23/2014   CLINICAL DATA:  Dementia. Chest pain. Shortness of breath. Hypoxia. Altered level of consciousness.  EXAM: CHEST  2 VIEW  COMPARISON:  02/09/2012  FINDINGS: There is at least moderate enlargement of the cardiopericardial silhouette. The patient is rotated to the Right on today's radiograph, reducing diagnostic sensitivity and specificity. Tortuous thoracic aorta. Upper zone pulmonary vascular prominence is nonspecific based on semi erect AP positioning. No overt edema identified. No significant pleural effusion.  Thoracic spondylosis.  IMPRESSION: 1. There is at least moderate enlargement of the cardiopericardial silhouette. No overt  edema. 2. Tortuous thoracic aorta.   Electronically Signed   By: Herbie Baltimore M.D.   On: 03/23/2014 14:49     EKG Interpretation   Date/Time:  Saturday March 23 2014 12:35:05 EST Ventricular Rate:  68 PR Interval:  139 QRS Duration: 78 QT Interval:  405 QTC Calculation: 431 R Axis:   24 Text Interpretation:  Sinus or ectopic atrial rhythm Low voltage,  precordial leads Abnormal R-wave progression, early transition Artifact  since last tracing no significant change Confirmed by WENTZ  MD, ELLIOTT  ([id number]) on 03/23/2014 12:46:04 PM     Meds given in ED:  Medications  acetaminophen (TYLENOL) suppository 650 mg (650 mg Rectal Given 03/23/14 1606)    New Prescriptions   No medications on file   Filed Vitals:   03/23/14 1441 03/23/14 1533 03/23/14 1536 03/23/14 1600  BP: 134/63  138/65 139/83  Pulse:  75  62 54  Temp:  101.8 F (38.8 C)    TempSrc:  Rectal    Resp: SpO2: 100%  100% 100%    MDM  Vitals stable - WNL febrile to 101.5, Tyl given in ED.  Pt resting comfortably in ED. Acting at his baseline PE--Benign lung exam. Normal abdominal exam. Labwork--nonspecific leukocytosis 15.9, No UTI, however, will send for culture. EKG not concerning. Imaging--chest x-ray shows no evidence of pneumonia  DDX--no evidence of pneumonia or UTI. Patient is tolerating PO in the ED. Will need to continue to take Tylenol for fever management. Will need to follow up with his Nursing Facility PCP for further evaluation and management of his symptoms. Pt stable, in good condition and is appropriate for discharge.  Prior to patient discharge, I discussed and reviewed this case with Dr.Wentz  Final diagnoses:  Leukocytosis  Fever, unspecified fever cause        Sharlene Motts, PA-C 03/23/14 1851  Sharlene Motts, PA-C 03/23/14 1851  Flint Melter, MD 03/24/14 [id number]

## 2014-03-23 NOTE — Discharge Instructions (Signed)
Fever, Adult A fever is a temperature of 100.4 F (38 C) or above.  HOME CARE  Take fever medicine as told by your doctor. Do not  take aspirin for fever if you are younger than 79 years of age.  If you are given antibiotic medicine, take it as told. Finish the medicine even if you start to feel better.  Rest.  Drink enough fluids to keep your pee (urine) clear or pale yellow. Do not drink alcohol.  Take a bath or shower with room temperature water. Do not use ice water or alcohol sponge baths.  Wear lightweight, loose clothes. GET HELP RIGHT AWAY IF:   You are short of breath or have trouble breathing.  You are very weak.  You are dizzy or you pass out (faint).  You are very thirsty or are making little or no urine.  You have new pain.  You throw up (vomit) or have watery poop (diarrhea).  You keep throwing up or having watery poop for more than 1 to 2 days.  You have a stiff neck or light bothers your eyes.  You have a skin rash.  You have a fever or problems (symptoms) that last for more than 2 to 3 days.  You have a fever and your problems quickly get worse.  You keep throwing up the fluids you drink.  You do not feel better after 3 days.  You have new problems. MAKE SURE YOU:   Understand these instructions.  Will watch your condition.  Will get help right away if you are not doing well or get worse. Document Released: 11/25/2007 Document Revised: 05/10/2011 Document Reviewed: 12/17/2010 [facility] Patient Information 2015 [place], [place]. This information is not intended to replace advice given to you by your health care provider. Make sure you discuss any questions you have with your health care provider.  Leukocytosis Leukocytosis means you have more white blood cells than normal. White blood cells are made in your bone marrow. The main job of white blood cells is to fight infection. Having too many white blood cells is a common condition. It can develop  as a result of many types of medical problems. CAUSES  In some cases, your bone marrow may be normal, but it is still making too many white blood cells. This could be the result of:  Infection.  Injury.  Physical stress.  Emotional stress.  Surgery.  Allergic reactions.  Tumors that do not start in the blood or bone marrow.  An inherited disease.  Certain medicines.  Pregnancy and labor. In other cases, you may have a bone marrow disorder that is causing your body to make too many white blood cells. Bone marrow disorders include:  Leukemia. This is a type of blood cancer.  Myeloproliferative disorders. These disorders cause blood cells to grow abnormally. SYMPTOMS  Some people have no symptoms. Others have symptoms due to the medical problem that is causing their leukocytosis. These symptoms may include:  Bleeding.  Bruising.  Fever.  Night sweats.  Repeated infections.  Weakness.  Weight loss. DIAGNOSIS  Leukocytosis is often found during blood tests that are done as part of a normal physical exam. Your caregiver will probably order other tests to help determine why you have too many white blood cells. These tests may include:  A complete blood count (CBC). This test measures all the types of blood cells in your body.  Chest X-rays, urine tests (urinalysis), or other tests to look for signs of infection.  Bone marrow aspiration. For this test, a needle is put into your bone. Cells from the bone marrow are removed through the needle. The cells are then examined under a microscope. TREATMENT  Treatment is usually not needed for leukocytosis. However, if a disorder is causing your leukocytosis, it will need to be treated. Treatment may include:  Antibiotic medicines if you have a bacterial infection.  Bone marrow transplant. Your diseased bone marrow is replaced with healthy cells that will grow new bone marrow.  Chemotherapy. This is the use of drugs to kill  cancer cells. HOME CARE INSTRUCTIONS  Only take over-the-counter or prescription medicines as directed by your caregiver.  Maintain a healthy weight. Ask your caregiver what weight is best for you.  Eat foods that are low in saturated fats and high in fiber. Eat plenty of fruits and vegetables.  Drink enough fluids to keep your urine clear or pale yellow.  Get 30 minutes of exercise at least 5 times a week. Check with your caregiver before starting a new exercise routine.  Limit caffeine and alcohol.  Do not smoke.  Keep all follow-up appointments as directed by your caregiver. SEEK MEDICAL CARE IF:  You feel weak or more tired than usual.  You develop chills, a cough, or nasal congestion.  You lose weight without trying.  You have night sweats.  You bruise easily. SEEK IMMEDIATE MEDICAL CARE IF:  You bleed more than normal.  You have chest pain.  You have trouble breathing.  You have a fever.  You have uncontrolled nausea or vomiting.  You feel dizzy or lightheaded. MAKE SURE YOU:  Understand these instructions.  Will watch your condition.  Will get help right away if you are not doing well or get worse. Document Released: 02/04/2011 Document Revised: 05/10/2011 Document Reviewed: 02/04/2011 [facility] Patient Information 2015 [place]. This information is not intended to replace advice given to you by your health care provider. Make sure you discuss any questions you have with your health care provider.   You were evaluated in the ED today. There does not appear to be an emergent cause for your symptoms at this time. There is no evidence of pneumonia or UTI at this time. Is important for you to continue to take Tylenol for your fever. Please follow-up with your primary care for further evaluation and management of your symptoms. Return to ED for worsening or new symptoms.

## 2014-03-23 NOTE — ED Notes (Signed)
WUJWJXBShakeva, [phone], [address] [employer] Tonona Love, called for update.   Updated pt. On plan of care and explained that all of the results have not resulted, and that we will call when we have a more information

## 2014-03-25 LAB — URINE CULTURE
CULTURE: NO GROWTH
Colony Count: NO GROWTH

## 2014-05-30 ENCOUNTER — Emergency Department (HOSPITAL_COMMUNITY)
Admission: EM | Admit: 2014-05-30 | Discharge: 2014-05-30 | Disposition: A | Discharge status: Home / Self Care | Payer: Medicare Other | Attending: Emergency Medicine | Admitting: Emergency Medicine

## 2014-05-30 DIAGNOSIS — Z79899 Other long term (current) drug therapy: Secondary | ICD-10-CM | POA: Diagnosis not present

## 2014-05-30 DIAGNOSIS — Z7982 Long term (current) use of aspirin: Secondary | ICD-10-CM | POA: Diagnosis not present

## 2014-05-30 DIAGNOSIS — F0390 Unspecified dementia without behavioral disturbance: Secondary | ICD-10-CM | POA: Insufficient documentation

## 2014-05-30 DIAGNOSIS — E119 Type 2 diabetes mellitus without complications: Secondary | ICD-10-CM | POA: Insufficient documentation

## 2014-05-30 DIAGNOSIS — D649 Anemia, unspecified: Secondary | ICD-10-CM | POA: Insufficient documentation

## 2014-05-30 DIAGNOSIS — Z8781 Personal history of (healed) traumatic fracture: Secondary | ICD-10-CM | POA: Diagnosis not present

## 2014-05-30 DIAGNOSIS — R319 Hematuria, unspecified: Secondary | ICD-10-CM | POA: Diagnosis present

## 2014-05-30 DIAGNOSIS — N390 Urinary tract infection, site not specified: Secondary | ICD-10-CM

## 2014-05-30 LAB — URINALYSIS, ROUTINE W REFLEX MICROSCOPIC
Bilirubin Urine: NEGATIVE
Glucose, UA: NEGATIVE mg/dL
Ketones, ur: NEGATIVE mg/dL
Nitrite: NEGATIVE
Protein, ur: 100 mg/dL — AB
Specific Gravity, Urine: 1.019 (ref 1.005–1.030)
Urobilinogen, UA: 0.2 mg/dL (ref 0.0–1.0)
pH: 8.5 — ABNORMAL HIGH (ref 5.0–8.0)

## 2014-05-30 LAB — CBG MONITORING, ED: Glucose-Capillary: 80 mg/dL (ref 70–99)

## 2014-05-30 LAB — URINE MICROSCOPIC-ADD ON

## 2014-05-30 MED ORDER — LORAZEPAM 2 MG/ML IJ SOLN: 1.0000 mg | Freq: Once | INTRAMUSCULAR | Status: DC

## 2014-05-30 MED ORDER — HYDROMORPHONE HCL 1 MG/ML IJ SOLN: 0.5000 mg | Freq: Once | INTRAMUSCULAR | Status: DC

## 2014-05-30 MED ORDER — ONDANSETRON HCL 4 MG/2ML IJ SOLN: 4.0000 mg | Freq: Once | INTRAMUSCULAR | Status: DC

## 2014-05-30 MED ORDER — SODIUM CHLORIDE 0.9 % IV SOLN: INTRAVENOUS | Status: DC

## 2014-05-30 MED ORDER — CEPHALEXIN 500 MG PO CAPS: 500.0000 mg | ORAL_CAPSULE | Freq: Four times a day (QID) | ORAL | Status: DC

## 2014-05-30 MED ORDER — LIDOCAINE HCL 1 % IJ SOLN
INTRAMUSCULAR | Status: AC
  Administered 2014-05-30: 1 mL
  Filled 2014-05-30: qty 20

## 2014-05-30 MED ORDER — CEFTRIAXONE SODIUM 1 G IJ SOLR
1.0000 g | Freq: Once | INTRAMUSCULAR | Status: AC
  Administered 2014-05-30: 1 g via INTRAMUSCULAR
  Filled 2014-05-30: qty 10

## 2014-05-30 NOTE — Discharge Instructions (Signed)

## 2014-05-30 NOTE — ED Notes (Signed)
Bed: WA25 Expected date:  Expected time:  Means of arrival:  Comments: ems  

## 2014-05-30 NOTE — ED Notes (Signed)
Per EMS, SNF reported blood in urine on 3/29. Pt has advanced dementia, EMS states they're unable to get an accurate assessment and they state the nursing home staff were unable to give any details regarding patient's symptoms.

## 2014-05-30 NOTE — Progress Notes (Signed)
[facility], [facility] unit. Per resident care coordinator, pt has history of hemorrhoids, and cream was used Tuesday night. However today they realized patient was urinated blood which began on 05/28/2014. This was found in brief in the front. Per [facility], attending at recommended patient to be medically stable. Per [facility], patient has no family, hcpoa, or support. [facility] primary support [facility].   Olga CoasterKristen Shabre Kreher, LCSW  Clinical Social Work  [facility] [phone]

## 2014-05-30 NOTE — ED Notes (Signed)
[facility] here to transport. Checked CBG earlier and found to be 80, gave pt milk and peanut butter crackers, pt ate them all. Alerted SNF to skin breakdown on upper inner thigh and placed barrier and moisturizer cream to site. Pt being loaded onto stretcher at this time.

## 2014-05-30 NOTE — ED Provider Notes (Signed)
CSN: [id number]     Arrival date & time 05/30/14  1145 History   First MD Initiated Contact with Patient 05/30/14 1149     Chief Complaint  Patient presents with  . Hematuria     (Consider location/radiation/quality/duration/timing/severity/associated sxs/prior Treatment) HPI   81 year old male sent from nursing home for evaluation of hematuria. Unclear onset. Patient has advanced dementia. Is unable to communicate any specific symptoms. He answers most questions "no" and occasionally echo back what I just asked him. No blood thinners aside from  aspirin per med list.   Past Medical History  Diagnosis Date  . Dementia   . Anemia   . Diabetes mellitus without complication   . Alcoholic psychosis   . Alcohol abuse   . Subtrochanteric fracture    No past surgical history on file. Family History  Problem Relation Age of Onset  . Other Mother   . Other Father    History  Substance Use Topics  . Smoking status: Not on file  . Smokeless tobacco: Not on file  . Alcohol Use: No    Review of Systems  All systems reviewed and negative, other than as noted in HPI.   Allergies  Review of patient's allergies indicates no known allergies.  Home Medications   Prior to Admission medications   Medication Sig Start Date End Date Taking? Authorizing Provider  Artificial Tear Ointment (ARTIFICIAL TEARS) ointment Place 1 application into both eyes at bedtime.   Yes Historical Provider, MD  aspirin EC 81 MG tablet Take 81 mg by mouth daily.   Yes Historical Provider, MD  B Complex-C-Folic Acid (PX B COMPLEX/VITAMIN C PO) Take 1 tablet by mouth daily.   Yes Historical Provider, MD  docusate sodium (COLACE) 100 MG capsule Take 100 mg by mouth at bedtime.    Yes Historical Provider, MD  galantamine (RAZADYNE ER) 8 MG 24 hr capsule Take 8 mg by mouth daily with breakfast.   Yes Historical Provider, MD  Ketotifen Fumarate (THERA TEARS ALLERGY OP) Apply 1 drop to eye 4 (four) times daily.    Yes Historical Provider, MD  metoprolol (LOPRESSOR) 50 MG tablet Take 0.5 tablets (25 mg total) by mouth 2 (two) times daily. 02/11/12  Yes Standley Brooking, MD  Multiple Vitamin (DAILY VITE PO) Take 1 tablet by mouth daily.   Yes Historical Provider, MD  nystatin (MYCOSTATIN/NYSTOP) 100000 UNIT/GM POWD Apply 1 Bottle topically 3 (three) times daily. Apply to abdomen folds until resolved   Yes Historical Provider, MD  polyethylene glycol (MIRALAX / GLYCOLAX) packet Take 17 g by mouth daily.   Yes Historical Provider, MD  ranitidine (ZANTAC) 150 MG tablet Take 150 mg by mouth at bedtime.   Yes Historical Provider, MD  acetaminophen (TYLENOL) 325 MG suppository Place 1 suppository (325 mg total) rectally every 6 (six) hours as needed for fever. 03/23/14   Toy Cookey, MD  loperamide (IMODIUM) 2 MG capsule Take 2 mg by mouth 4 (four) times daily as needed. For loose stool    Historical Provider, MD   BP 119/63 mmHg  Pulse 58  Temp(Src) 98.7 F (37.1 C) (Rectal)  Resp 17  SpO2 100% Physical Exam  Constitutional: He appears well-developed and well-nourished. No distress.  HENT:  Head: Normocephalic and atraumatic.  Eyes: Conjunctivae are normal. Right eye exhibits no discharge. Left eye exhibits no discharge.  Neck: Neck supple.  Cardiovascular: Normal rate, regular rhythm and normal heart sounds.  Exam reveals no gallop and no friction rub.  No murmur heard. Pulmonary/Chest: Effort normal and breath sounds normal. No respiratory distress.  Abdominal: Soft. He exhibits no distension. There is no tenderness.  Genitourinary:  No cva tenderness. Normal external genitalia. No blood noted at meatus.   Musculoskeletal: He exhibits no edema or tenderness.  Neurological: He is alert.  Skin: Skin is warm and dry.  Nursing note and vitals reviewed.   ED Course  Procedures (including critical care time) Labs Review Labs Reviewed  URINALYSIS, ROUTINE W REFLEX MICROSCOPIC    Imaging  Review No results found.   EKG Interpretation   Date/Time:  Thursday May 30 2014 12:15:17 EDT Ventricular Rate:  48 PR Interval:    QRS Duration: 87 QT Interval:  487 QTC Calculation: 435 R Axis:   15 Text Interpretation:  Atrial fibrillation Low voltage, precordial leads  Abnormal R-wave progression, early transition Baseline wander in lead(s)  III aVF V1 V2 V3 V4 Confirmed by Juleen ChinaKOHUT  MD, Brinn Westby ([id number]) on 05/30/2014  3:13:23 PM      MDM   Final diagnoses:  UTI (lower urinary tract infection)    81 year old male sent for evaluation of hematuria. Patient has advanced dementia. Is unable to provide any useful history. Will check urinalysis. Difficult to tell if patient is having any specific symptoms. He has no abdominal tenderness. He is afebrile. Is in atrial fibrillation. Noted on previous EKGs. Occasionally bradycardic with heart rates going down into the low 40s. He maintains a good blood pressure though and does not appear to be any in acute distress.  UA consistent with UTI. Most likely explanation for perceived hematuria. Urine culture sent. Abx. I feel appropriate for outpt treatment at this time.     Raeford RazorStephen Lamarcus Spira, MD 06/01/14 [phone]

## 2014-06-01 LAB — URINE CULTURE: Colony Count: >=100000

## 2014-06-03 ENCOUNTER — Telehealth (HOSPITAL_COMMUNITY): Payer: Self-pay

## 2014-06-03 NOTE — Telephone Encounter (Signed)
Post ED Visit - Positive Culture Follow-up  Culture report reviewed by antimicrobial stewardship pharmacist: []  Wes Dulaney, Pharm.D., BCPS []  Celedonio MiyamotoJeremy Frens, Pharm.D., BCPS []  Georgina PillionElizabeth Martin, Pharm.D., BCPS []  [place] CampMinh Pham, [address]Pharm[address]D., BCPS, AAHIVP []  Estella HuskMichelle Turner, Pharm.D., BCPS, AAHIVP []  Elder CyphersLorie Poole, [address]Pharm.D., BCPS X  Christoper Fabianaron Amend Pharm. D.  Positive Urine culture, 100,000 colonies -> Proteus Mirabilis Treated with Cephalexin, organism sensitive to the same and no further patient follow-up is required at this time.   Arvid RightClark, Tanita Palinkas Dorn 06/03/2014, 5:28 AM

## 2015-04-06 ENCOUNTER — Inpatient Hospital Stay (HOSPITAL_COMMUNITY)
Admission: EM | Admit: 2015-04-06 | Discharge: 2015-04-11 | DRG: 378 | Disposition: A | Discharge status: Hospice | Payer: Medicare Other | Attending: Internal Medicine | Admitting: Internal Medicine

## 2015-04-06 ENCOUNTER — Encounter (HOSPITAL_COMMUNITY): Payer: Self-pay

## 2015-04-06 DIAGNOSIS — E876 Hypokalemia: Secondary | ICD-10-CM | POA: Diagnosis present

## 2015-04-06 DIAGNOSIS — I129 Hypertensive chronic kidney disease with stage 1 through stage 4 chronic kidney disease, or unspecified chronic kidney disease: Secondary | ICD-10-CM | POA: Diagnosis present

## 2015-04-06 DIAGNOSIS — N1330 Unspecified hydronephrosis: Secondary | ICD-10-CM | POA: Diagnosis present

## 2015-04-06 DIAGNOSIS — Z515 Encounter for palliative care: Secondary | ICD-10-CM | POA: Insufficient documentation

## 2015-04-06 DIAGNOSIS — E1122 Type 2 diabetes mellitus with diabetic chronic kidney disease: Secondary | ICD-10-CM | POA: Diagnosis present

## 2015-04-06 DIAGNOSIS — N183 Chronic kidney disease, stage 3 (moderate): Secondary | ICD-10-CM | POA: Diagnosis present

## 2015-04-06 DIAGNOSIS — R0902 Hypoxemia: Secondary | ICD-10-CM | POA: Diagnosis present

## 2015-04-06 DIAGNOSIS — F0390 Unspecified dementia without behavioral disturbance: Secondary | ICD-10-CM | POA: Diagnosis present

## 2015-04-06 DIAGNOSIS — K922 Gastrointestinal hemorrhage, unspecified: Secondary | ICD-10-CM | POA: Diagnosis present

## 2015-04-06 DIAGNOSIS — Z66 Do not resuscitate: Secondary | ICD-10-CM | POA: Diagnosis present

## 2015-04-06 DIAGNOSIS — L8990 Pressure ulcer of unspecified site, unspecified stage: Secondary | ICD-10-CM | POA: Diagnosis present

## 2015-04-06 DIAGNOSIS — I480 Paroxysmal atrial fibrillation: Secondary | ICD-10-CM | POA: Diagnosis not present

## 2015-04-06 DIAGNOSIS — D696 Thrombocytopenia, unspecified: Secondary | ICD-10-CM | POA: Diagnosis not present

## 2015-04-06 DIAGNOSIS — Z7189 Other specified counseling: Secondary | ICD-10-CM | POA: Insufficient documentation

## 2015-04-06 DIAGNOSIS — N179 Acute kidney failure, unspecified: Secondary | ICD-10-CM | POA: Diagnosis present

## 2015-04-06 DIAGNOSIS — Z833 Family history of diabetes mellitus: Secondary | ICD-10-CM

## 2015-04-06 DIAGNOSIS — I4891 Unspecified atrial fibrillation: Secondary | ICD-10-CM | POA: Diagnosis present

## 2015-04-06 DIAGNOSIS — Z8249 Family history of ischemic heart disease and other diseases of the circulatory system: Secondary | ICD-10-CM | POA: Diagnosis not present

## 2015-04-06 DIAGNOSIS — E0821 Diabetes mellitus due to underlying condition with diabetic nephropathy: Secondary | ICD-10-CM

## 2015-04-06 DIAGNOSIS — E1121 Type 2 diabetes mellitus with diabetic nephropathy: Secondary | ICD-10-CM | POA: Diagnosis present

## 2015-04-06 DIAGNOSIS — I10 Essential (primary) hypertension: Secondary | ICD-10-CM | POA: Diagnosis not present

## 2015-04-06 DIAGNOSIS — E870 Hyperosmolality and hypernatremia: Secondary | ICD-10-CM | POA: Diagnosis not present

## 2015-04-06 DIAGNOSIS — K2961 Other gastritis with bleeding: Secondary | ICD-10-CM | POA: Diagnosis not present

## 2015-04-06 LAB — PROTIME-INR
INR: 1.16 (ref 0.00–1.49)
PROTHROMBIN TIME: 15 s (ref 11.6–15.2)

## 2015-04-06 LAB — CBC
HCT: 34.9 % — ABNORMAL LOW (ref 39.0–52.0)
HEMATOCRIT: 37.7 % — AB (ref 39.0–52.0)
HEMOGLOBIN: 12.1 g/dL — AB (ref 13.0–17.0)
Hemoglobin: 12.8 g/dL — ABNORMAL LOW (ref 13.0–17.0)
MCH: 35.1 pg — ABNORMAL HIGH (ref 26.0–34.0)
MCH: 34.9 pg — AB (ref 26.0–34.0)
MCHC: 34 g/dL (ref 30.0–36.0)
MCHC: 34.7 g/dL (ref 30.0–36.0)
MCV: 103.3 fL — AB (ref 78.0–100.0)
MCV: 100.6 fL — ABNORMAL HIGH (ref 78.0–100.0)
PLATELETS: 168 10*3/uL (ref 150–400)
PLATELETS: 126 10*3/uL — AB (ref 150–400)
RBC: 3.65 MIL/uL — ABNORMAL LOW (ref 4.22–5.81)
RBC: 3.47 MIL/uL — ABNORMAL LOW (ref 4.22–5.81)
RDW: 13.3 % (ref 11.5–15.5)
RDW: 13.1 % (ref 11.5–15.5)
WBC: 5.4 10*3/uL (ref 4.0–10.5)
WBC: 6.1 10*3/uL (ref 4.0–10.5)

## 2015-04-06 LAB — CBC WITH DIFFERENTIAL/PLATELET
BASOS ABS: 0 10*3/uL (ref 0.0–0.1)
Basophils Relative: 1 %
Eosinophils Absolute: 0.2 10*3/uL (ref 0.0–0.7)
Eosinophils Relative: 3 %
HEMATOCRIT: 38.4 % — AB (ref 39.0–52.0)
HEMOGLOBIN: 13.4 g/dL (ref 13.0–17.0)
Lymphocytes Relative: 19 %
Lymphs Abs: 1.1 10*3/uL (ref 0.7–4.0)
MCH: 35.4 pg — ABNORMAL HIGH (ref 26.0–34.0)
MCHC: 34.9 g/dL (ref 30.0–36.0)
MCV: 101.3 fL — ABNORMAL HIGH (ref 78.0–100.0)
Monocytes Absolute: 0.5 10*3/uL (ref 0.1–1.0)
Monocytes Relative: 8 %
NEUTROS ABS: 3.9 10*3/uL (ref 1.7–7.7)
Neutrophils Relative %: 69 %
Platelets: 150 10*3/uL (ref 150–400)
RBC: 3.79 MIL/uL — AB (ref 4.22–5.81)
RDW: 13.2 % (ref 11.5–15.5)
WBC: 5.6 10*3/uL (ref 4.0–10.5)

## 2015-04-06 LAB — URINALYSIS, ROUTINE W REFLEX MICROSCOPIC
BILIRUBIN URINE: NEGATIVE
Glucose, UA: NEGATIVE mg/dL
HGB URINE DIPSTICK: NEGATIVE
KETONES UR: NEGATIVE mg/dL
Leukocytes, UA: NEGATIVE
NITRITE: NEGATIVE
PROTEIN: NEGATIVE mg/dL
SPECIFIC GRAVITY, URINE: 1.017 (ref 1.005–1.030)
pH: 6.5 (ref 5.0–8.0)

## 2015-04-06 LAB — COMPREHENSIVE METABOLIC PANEL
ALK PHOS: 59 U/L (ref 38–126)
ALT: 16 U/L — ABNORMAL LOW (ref 17–63)
AST: 26 U/L (ref 15–41)
Albumin: 3.5 g/dL (ref 3.5–5.0)
Anion gap: 6 (ref 5–15)
BILIRUBIN TOTAL: 0.7 mg/dL (ref 0.3–1.2)
BUN: 27 mg/dL — ABNORMAL HIGH (ref 6–20)
CO2: 27 mmol/L (ref 22–32)
Calcium: 9.1 mg/dL (ref 8.9–10.3)
Chloride: 109 mmol/L (ref 101–111)
Creatinine, Ser: 1.29 mg/dL — ABNORMAL HIGH (ref 0.61–1.24)
GFR calc Af Amer: 57 mL/min — ABNORMAL LOW (ref >60)
GFR, EST NON AFRICAN AMERICAN: 49 mL/min — AB (ref >60)
Glucose, Bld: 154 mg/dL — ABNORMAL HIGH (ref 65–99)
Potassium: 4.3 mmol/L (ref 3.5–5.1)
Sodium: 142 mmol/L (ref 135–145)
Total Protein: 6.5 g/dL (ref 6.5–8.1)

## 2015-04-06 LAB — I-STAT CHEM 8, ED
BUN: 29 mg/dL — AB (ref 6–20)
CALCIUM ION: 1.18 mmol/L (ref 1.13–1.30)
Chloride: 106 mmol/L (ref 101–111)
Creatinine, Ser: 1.3 mg/dL — ABNORMAL HIGH (ref 0.61–1.24)
Glucose, Bld: 143 mg/dL — ABNORMAL HIGH (ref 65–99)
HCT: 39 % (ref 39.0–52.0)
Hemoglobin: 13.3 g/dL (ref 13.0–17.0)
POTASSIUM: 4.2 mmol/L (ref 3.5–5.1)
Sodium: 144 mmol/L (ref 135–145)
TCO2: 28 mmol/L (ref 0–100)

## 2015-04-06 LAB — GLUCOSE, CAPILLARY: Glucose-Capillary: 80 mg/dL (ref 65–99)

## 2015-04-06 LAB — TROPONIN I: Troponin I: <0.03 ng/mL (ref <0.031)

## 2015-04-06 LAB — APTT: aPTT: 34 seconds (ref 24–37)

## 2015-04-06 LAB — TSH: TSH: 1.017 u[IU]/mL (ref 0.350–4.500)

## 2015-04-06 LAB — POC OCCULT BLOOD, ED: Fecal Occult Bld: POSITIVE — AB

## 2015-04-06 LAB — MRSA PCR SCREENING: MRSA BY PCR: POSITIVE — AB

## 2015-04-06 MED ORDER — ONDANSETRON HCL 4 MG/2ML IJ SOLN: 4.0000 mg | Freq: Four times a day (QID) | INTRAMUSCULAR | Status: DC | PRN

## 2015-04-06 MED ORDER — RESOURCE THICKENUP CLEAR PO POWD
ORAL | Status: DC | PRN
  Filled 2015-04-06: qty 125

## 2015-04-06 MED ORDER — GALANTAMINE HYDROBROMIDE ER 8 MG PO CP24
8.0000 mg | ORAL_CAPSULE | Freq: Every day | ORAL | Status: DC
  Filled 2015-04-06: qty 1

## 2015-04-06 MED ORDER — ONDANSETRON HCL 4 MG PO TABS: 4.0000 mg | ORAL_TABLET | Freq: Four times a day (QID) | ORAL | Status: DC | PRN

## 2015-04-06 MED ORDER — METOPROLOL TARTRATE 12.5 MG HALF TABLET: 12.5000 mg | ORAL_TABLET | Freq: Every day | ORAL | Status: DC

## 2015-04-06 MED ORDER — LEVALBUTEROL HCL 0.63 MG/3ML IN NEBU: 0.6300 mg | INHALATION_SOLUTION | Freq: Four times a day (QID) | RESPIRATORY_TRACT | Status: DC | PRN

## 2015-04-06 MED ORDER — ASPIRIN EC 81 MG PO TBEC
81.0000 mg | DELAYED_RELEASE_TABLET | Freq: Every day | ORAL | Status: DC
  Administered 2015-04-07 – 2015-04-11 (×5): 81 mg via ORAL
  Filled 2015-04-06 (×6): qty 1

## 2015-04-06 MED ORDER — SODIUM CHLORIDE 0.9% FLUSH
3.0000 mL | Freq: Two times a day (BID) | INTRAVENOUS | Status: DC
  Administered 2015-04-06 – 2015-04-10 (×7): 3 mL via INTRAVENOUS

## 2015-04-06 MED ORDER — PANTOPRAZOLE SODIUM 40 MG IV SOLR
40.0000 mg | Freq: Two times a day (BID) | INTRAVENOUS | Status: DC
  Administered 2015-04-06 – 2015-04-09 (×8): 40 mg via INTRAVENOUS
  Filled 2015-04-06 (×9): qty 40

## 2015-04-06 MED ORDER — OXYCODONE HCL 5 MG PO TABS: 5.0000 mg | ORAL_TABLET | ORAL | Status: DC | PRN

## 2015-04-06 MED ORDER — CETYLPYRIDINIUM CHLORIDE 0.05 % MT LIQD
7.0000 mL | Freq: Two times a day (BID) | OROMUCOSAL | Status: DC
  Administered 2015-04-06 – 2015-04-11 (×9): 7 mL via OROMUCOSAL

## 2015-04-06 MED ORDER — SODIUM CHLORIDE 0.9 % IV SOLN
INTRAVENOUS | Status: DC
  Administered 2015-04-07: 03:00:00 via INTRAVENOUS

## 2015-04-06 MED ORDER — ACETAMINOPHEN 325 MG PO TABS: 650.0000 mg | ORAL_TABLET | Freq: Four times a day (QID) | ORAL | Status: DC | PRN

## 2015-04-06 MED ORDER — SODIUM CHLORIDE 0.9 % IV SOLN
10.0000 mL/h | Freq: Once | INTRAVENOUS | Status: AC
  Administered 2015-04-06: 10 mL/h via INTRAVENOUS

## 2015-04-06 MED ORDER — POLYETHYLENE GLYCOL 3350 17 G PO PACK
17.0000 g | PACK | Freq: Every day | ORAL | Status: DC
  Administered 2015-04-07 – 2015-04-11 (×5): 17 g via ORAL
  Filled 2015-04-06 (×6): qty 1

## 2015-04-06 MED ORDER — DOCUSATE SODIUM 100 MG PO CAPS
100.0000 mg | ORAL_CAPSULE | Freq: Every day | ORAL | Status: DC
  Administered 2015-04-06 – 2015-04-10 (×5): 100 mg via ORAL
  Filled 2015-04-06 (×5): qty 1

## 2015-04-06 MED ORDER — GALANTAMINE HYDROBROMIDE 4 MG PO TABS
4.0000 mg | ORAL_TABLET | Freq: Two times a day (BID) | ORAL | Status: DC
  Administered 2015-04-07 – 2015-04-11 (×9): 4 mg via ORAL
  Filled 2015-04-06 (×12): qty 1

## 2015-04-06 MED ORDER — ACETAMINOPHEN 650 MG RE SUPP
650.0000 mg | Freq: Four times a day (QID) | RECTAL | Status: DC | PRN
Start: 2015-04-06 — End: 2015-04-11

## 2015-04-06 MED ORDER — INSULIN ASPART 100 UNIT/ML ~~LOC~~ SOLN
0.0000 [IU] | Freq: Three times a day (TID) | SUBCUTANEOUS | Status: DC
  Administered 2015-04-08 (×2): 1 [IU] via SUBCUTANEOUS
  Administered 2015-04-11: 2 [IU] via SUBCUTANEOUS

## 2015-04-06 NOTE — ED Provider Notes (Signed)
CSN: [id number]     Arrival date & time 04/06/15  1478 History   First MD Initiated Contact with Patient 04/06/15 0957     Chief Complaint  Patient presents with  . Rectal Bleeding     (Consider location/radiation/quality/duration/timing/severity/associated sxs/prior Treatment) HPI 82 year old male who presents with rectal bleeding. History of dementia, anemia, diabetes. Does not take any anticoagulants. Presents from [facility], where nurses found him sitting in a large amount of blood and blood clots this morning. Patient is unable to write any history due to underlying dementia. Nursing facility states that he is currently full code. He has a legal guardian from [employer]. Multiple attempts were made to contact his legal guardian, but cannot be reached.   Past Medical History  Diagnosis Date  . Dementia   . Anemia   . Diabetes mellitus without complication (HCC)   . Alcoholic psychosis (HCC)   . Alcohol abuse   . Subtrochanteric fracture (HCC)    History reviewed. No pertinent past surgical history. Family History  Problem Relation Age of Onset  . Other Mother   . Other Father    Social History  Substance Use Topics  . Smoking status: None  . Smokeless tobacco: None  . Alcohol Use: No    Review of Systems 10/14 systems reviewed and are negative other than those stated in the HPI   Allergies  Review of patient's allergies indicates no known allergies.  Home Medications   Prior to Admission medications   Medication Sig Start Date End Date Taking? Authorizing Provider  acetaminophen (TYLENOL) 325 MG tablet Take 650 mg by mouth every 6 (six) hours as needed for mild pain.   Yes Historical Provider, MD  Artificial Tear Ointment (ARTIFICIAL TEARS) ointment Place 1 application into both eyes at bedtime.   Yes Historical Provider, MD  aspirin EC 81 MG tablet Take 81 mg by mouth daily.   Yes Historical Provider, MD  docusate sodium (COLACE) 100 MG capsule Take 100 mg by mouth  at bedtime.    Yes Historical Provider, MD  galantamine (RAZADYNE ER) 8 MG 24 hr capsule Take 8 mg by mouth daily with breakfast.   Yes Historical Provider, MD  Ketotifen Fumarate (THERA TEARS ALLERGY OP) Apply 1 drop to eye 4 (four) times daily.   Yes Historical Provider, MD  losartan (COZAAR) 25 MG tablet Take 25 mg by mouth daily.   Yes Historical Provider, MD  metoprolol tartrate (LOPRESSOR) 25 MG tablet Take 12.5 mg by mouth daily.   Yes Historical Provider, MD  Multiple Vitamin (DAILY VITE PO) Take 1 tablet by mouth daily.   Yes Historical Provider, MD  polyethylene glycol (MIRALAX / GLYCOLAX) packet Take 17 g by mouth daily.   Yes Historical Provider, MD  ranitidine (ZANTAC) 150 MG tablet Take 150 mg by mouth at bedtime.   Yes Historical Provider, MD  metoprolol (LOPRESSOR) 50 MG tablet Take 0.5 tablets (25 mg total) by mouth 2 (two) times daily. Patient not taking: Reported on 04/06/2015 02/11/12   Standley Brooking, MD   BP 113/44 mmHg  Pulse 55  Temp(Src) 97.8 F (36.6 C) (Rectal)  Resp 14  SpO2 100% Physical Exam Physical Exam  Nursing note and vitals reviewed. Constitutional: elderly, in no acute distress Head: Normocephalic and atraumatic.  Mouth/Throat: Oropharynx is clear but dry. Neck: Normal range of motion. Neck supple.  Cardiovascular: Bradycardic rate and irregular rhythm.   Pulmonary/Chest: Effort normal and breath sounds normal.  Abdominal: Soft. No distention. There is  no tenderness. There is no rebound and no guarding.  rectal exam revealing hematochezia.  Musculoskeletal: Normal range of motion.  Neurological: Alert, moves all extremities symmetrically Skin: Skin is warm and dry.  Psychiatric: Cooperative  ED Course  Procedures (including critical care time) Labs Review Labs Reviewed  CBC WITH DIFFERENTIAL/PLATELET - Abnormal; Notable for the following:    RBC 3.79 (*)    HCT 38.4 (*)    MCV 101.3 (*)    MCH 35.4 (*)    All other components within  normal limits  COMPREHENSIVE METABOLIC PANEL - Abnormal; Notable for the following:    Glucose, Bld 154 (*)    BUN 27 (*)    Creatinine, Ser 1.29 (*)    ALT 16 (*)    GFR calc non Af Amer 49 (*)    GFR calc Af Amer 57 (*)    All other components within normal limits  POC OCCULT BLOOD, ED - Abnormal; Notable for the following:    Fecal Occult Bld POSITIVE (*)    All other components within normal limits  I-STAT CHEM 8, ED - Abnormal; Notable for the following:    BUN 29 (*)    Creatinine, Ser 1.30 (*)    Glucose, Bld 143 (*)    All other components within normal limits  PROTIME-INR  APTT    Imaging Review No results found. I have personally reviewed and evaluated these images and lab results as part of my medical decision-making.   EKG Interpretation   Date/Time:  Sunday April 06 2015 10:21:00 EST Ventricular Rate:  43 PR Interval:    QRS Duration: 104 QT Interval:  467 QTC Calculation: 395 R Axis:   44 Text Interpretation:  Atrial fibrillation Borderline repolarization  abnormality Chronic atrial fibrillation Confirmed by LIU MD, Annabelle Harman ([id number])  on 04/06/2015 10:27:30 AM      MDM   Final diagnoses:  Lower GI bleed    82 year old male with history of chronic atrial fibrillation, diabetes, and dementia who presents with lower GI bleeding. Is alert and only oriented to his name on presentation. Hemodynamically stable. Is a soft and benign abdomen with evidence of hematochezia on rectal exam. Hemoglobin noted to be 13, with baseline around 14. No evidence of coagulopathy. BUN of 27 with a creatinine of 1.29, not consistent with upper GI bleeding. Discussed with Dr. Ewing Schlein from gastroenterology. States that he is nonicteric candidate for endoscopic intervention. Recommended nuclear medicine scan if continued bleeding with potential IR intervention if needed. I discussed with hospitalist, who will admit for ongoing management.    Lavera Guise, MD 04/06/15 1228

## 2015-04-06 NOTE — ED Notes (Signed)
Monitor shows bradycardia rate 35-50.  Pt. Remains awake, alert and relaxed in demeanor.

## 2015-04-06 NOTE — ED Notes (Signed)
He pulled out his IV.  We apply bilt. Wrist mitts and I re-start his IV.  He remains in no distress.  He also remains profoundly confused.

## 2015-04-06 NOTE — Progress Notes (Signed)
CRITICAL VALUE ALERT  Critical value received:  Positive MRSA in nares  Date of notification:  04/06/2015  Time of notification:  1558  Critical value read back:Yes.    Nurse who received alert:  Santiago Glad, RN  MD notified (1st page):  Dr. Susie Cassette  Time of first page:  1600  Responding MD:  Dr. Susie Cassette  Time MD responded:  [phone]

## 2015-04-06 NOTE — ED Notes (Signed)
Staff at [facility] skilled nursing observed pt. To have a quantity of blood at rectal/perineal area this morning.  He is awake, alert and confused at his baseline.  He is not on any pharmacologic anticoagulants.  I find his perineal and rectal exam to be grossly normal with no sores nor open areas.  Our thermometer tip came out bloody after rectal temp. Taken (hematochezia).

## 2015-04-06 NOTE — H&P (Signed)
[facility] History and Physical  Kevin Dawson ZOX:[id number] DOB: May 20, 1927 DOA: 04/06/2015  Referring physician:   PCP: Florentina Jenny, MD   Chief Complaint:  GI bleed  HPI:  82 yr old male with history of dementia, anemia, diabetes, prior history of alcohol abuse with no ongoing use of alcohol at this time (verified with the nursing home) , who was brought into the ER because  he was walking down the hall at his memory care unit, all covered in blood and his depends were found to be full of blood clots. Discussed with the RN and the nursing home and the patient does not drink alcohol. Patient is unable to provide any history because of his dementia. In the ER patient is hemodynamically stable and has not had any recurrent GI bleeding. Hemoglobin done twice and stable around 13.3      Review of Systems:  Unable to obtain review of systems secondary to dementia     Past Medical History  Diagnosis Date  . Dementia   . Anemia   . Diabetes mellitus without complication (HCC)   . Alcoholic psychosis (HCC)   . Alcohol abuse   . Subtrochanteric fracture (HCC)      History reviewed. No pertinent past surgical history.    Social History:  reports that he does not drink alcohol. His tobacco and drug histories are not on file.    No Known Allergies  Family History  Problem Relation Age of Onset  . Other Mother   . Other Father        Prior to Admission medications   Medication Sig Start Date End Date Taking? Authorizing Provider  acetaminophen (TYLENOL) 325 MG tablet Take 650 mg by mouth every 6 (six) hours as needed for mild pain.   Yes Historical Provider, MD  Artificial Tear Ointment (ARTIFICIAL TEARS) ointment Place 1 application into both eyes at bedtime.   Yes Historical Provider, MD  aspirin EC 81 MG tablet Take 81 mg by mouth daily.   Yes Historical Provider, MD  docusate sodium (COLACE) 100 MG capsule Take 100 mg by mouth at bedtime.    Yes Historical  Provider, MD  galantamine (RAZADYNE ER) 8 MG 24 hr capsule Take 8 mg by mouth daily with breakfast.   Yes Historical Provider, MD  Ketotifen Fumarate (THERA TEARS ALLERGY OP) Apply 1 drop to eye 4 (four) times daily.   Yes Historical Provider, MD  losartan (COZAAR) 25 MG tablet Take 25 mg by mouth daily.   Yes Historical Provider, MD  metoprolol tartrate (LOPRESSOR) 25 MG tablet Take 12.5 mg by mouth daily.   Yes Historical Provider, MD  Multiple Vitamin (DAILY VITE PO) Take 1 tablet by mouth daily.   Yes Historical Provider, MD  polyethylene glycol (MIRALAX / GLYCOLAX) packet Take 17 g by mouth daily.   Yes Historical Provider, MD  ranitidine (ZANTAC) 150 MG tablet Take 150 mg by mouth at bedtime.   Yes Historical Provider, MD  metoprolol (LOPRESSOR) 50 MG tablet Take 0.5 tablets (25 mg total) by mouth 2 (two) times daily. Patient not taking: Reported on 04/06/2015 02/11/12   Standley Brooking, MD     Physical Exam: Filed Vitals:   04/06/15 1009 04/06/15 1101 04/06/15 1234  BP: 119/50 113/44 132/57  Pulse: 45 55 46  Temp: 97.8 F (36.6 C)    TempSrc: Rectal    Resp: 14 14 17   SpO2: 100% 100% 99%     Constitutional: Vital signs reviewed. Patient is  a well-developed and well-nourished in no acute distress and not  cooperative with exam, Confused ead: Normocephalic and atraumatic  Ear: TM normal bilaterally  Mouth: no erythema or exudates, dry  mucous membranes  Eyes: PERRL, EOMI, conjunctivae normal, No scleral icterus.  Neck: Supple, Trachea midline normal ROM, No JVD, mass, thyromegaly, or carotid bruit present.  Cardiovascular: RRR, S1 normal, S2 normal, no MRG, pulses symmetric and intact bilaterally  Pulmonary/Chest: CTAB, no wheezes, rales, or rhonchi  Abdominal: Soft. Non-tender, non-distended, bowel sounds are normal, no masses, organomegaly, or guarding present.  GU: no CVA tenderness Musculoskeletal: No joint deformities, erythema, or stiffness, ROM full and no  nontender Ext: no edema and no cyanosis, pulses palpable bilaterally (DP and PT)  Hematology: no cervical, inginal, or axillary adenopathy.  Neurological: A&O x3, Strenght is normal and symmetric bilaterally, cranial nerve II-XII are grossly intact, no focal motor deficit, sensory intact to light touch bilaterally.  Skin: Warm, dry and intact. No rash, cyanosis, or clubbing.  Psychiatric: Normal mood and affect. speech and behavior is normal. Judgment and thought content normal. Cognition and memory are normal.      Data Review   Micro Results No results found for this or any previous visit (from the past 240 hour(s)).  Radiology Reports No results found.   CBC  Recent Labs Lab 04/06/15 1039 04/06/15 1056  WBC 5.6  --   HGB 13.4 13.3  HCT 38.4* 39.0  PLT 150  --   MCV 101.3*  --   MCH 35.4*  --   MCHC 34.9  --   RDW 13.2  --   LYMPHSABS 1.1  --   MONOABS 0.5  --   EOSABS 0.2  --   BASOSABS 0.0  --     Chemistries   Recent Labs Lab 04/06/15 1039 04/06/15 1056  NA 142 144  K 4.3 4.2  CL 109 106  CO2 27  --   GLUCOSE 154* 143*  BUN 27* 29*  CREATININE 1.29* 1.30*  CALCIUM 9.1  --   AST 26  --   ALT 16*  --   ALKPHOS 59  --   BILITOT 0.7  --    ------------------------------------------------------------------------------------------------------------------ CrCl cannot be calculated (Unknown ideal weight.). ------------------------------------------------------------------------------------------------------------------ No results for input(s): HGBA1C in the last 72 hours. ------------------------------------------------------------------------------------------------------------------ No results for input(s): CHOL, HDL, LDLCALC, TRIG, CHOLHDL, LDLDIRECT in the last 72 hours. ------------------------------------------------------------------------------------------------------------------ No results for input(s): TSH, T4TOTAL, T3FREE, THYROIDAB in the last  72 hours.  Invalid input(s): FREET3 ------------------------------------------------------------------------------------------------------------------ No results for input(s): VITAMINB12, FOLATE, FERRITIN, TIBC, IRON, RETICCTPCT in the last 72 hours.  Coagulation profile  Recent Labs Lab 04/06/15 1039  INR 1.16    No results for input(s): DDIMER in the last 72 hours.  Cardiac Enzymes No results for input(s): CKMB, TROPONINI, MYOGLOBIN in the last 168 hours.  Invalid input(s): CK ------------------------------------------------------------------------------------------------------------------ Invalid input(s): POCBNP   CBG: No results for input(s): GLUCAP in the last 168 hours.     EKG: Independently reviewed.    Assessment/Plan Rectal bleeding-hemoglobin appears to be stable, EDP discussed with Dr. Ewing Schlein  from gastroenterology, given his dementia he's not a candidate for invasive workup, GI recommended a nuclear medicine scan if the patient continues to have recurrent bleeding with potential IR intervention Will check serial CBC Start patient on PPI given prior history of alcohol abuse Patient is not on any aspirin Plavix or blood thinners at the nursing home Admit to stepdown  He is a DNR   Atrial fibrillation-rate  controlled, continue beta blocker, patient is not on anticoagulation given advanced age     Dementia-continue dementia medications    Hypertension-hold Cozaar, continue metoprolol    DM (diabetes mellitus) (HCC)-check hemoglobin A1c and start sliding scale insulin  Chronic kidney disease stage II, kidney function appears to be at baseline        Code Status Orders    DNR    Start     Ordered     Family Communication: bedside Disposition Plan: admit   Total time spent 55 minutes.Greater than 50% of this time was spent in counseling, explanation of diagnosis, planning of further management, and coordination of care  [facility] Pager [phone]  If 7PM-7AM, please contact night-coverage [email or web address] Password [email or web address] 04/06/2015, 12:46 PM

## 2015-04-06 NOTE — ED Notes (Signed)
Bed: WA14 Expected date:  Expected time:  Means of arrival:  Comments: ems 

## 2015-04-07 ENCOUNTER — Encounter (HOSPITAL_COMMUNITY): Payer: Self-pay

## 2015-04-07 DIAGNOSIS — K2961 Other gastritis with bleeding: Secondary | ICD-10-CM

## 2015-04-07 DIAGNOSIS — I10 Essential (primary) hypertension: Secondary | ICD-10-CM

## 2015-04-07 DIAGNOSIS — Z7189 Other specified counseling: Secondary | ICD-10-CM

## 2015-04-07 DIAGNOSIS — E1121 Type 2 diabetes mellitus with diabetic nephropathy: Secondary | ICD-10-CM

## 2015-04-07 DIAGNOSIS — Z515 Encounter for palliative care: Secondary | ICD-10-CM

## 2015-04-07 DIAGNOSIS — K922 Gastrointestinal hemorrhage, unspecified: Principal | ICD-10-CM

## 2015-04-07 DIAGNOSIS — N179 Acute kidney failure, unspecified: Secondary | ICD-10-CM

## 2015-04-07 DIAGNOSIS — F0390 Unspecified dementia without behavioral disturbance: Secondary | ICD-10-CM

## 2015-04-07 DIAGNOSIS — E870 Hyperosmolality and hypernatremia: Secondary | ICD-10-CM

## 2015-04-07 DIAGNOSIS — I480 Paroxysmal atrial fibrillation: Secondary | ICD-10-CM

## 2015-04-07 DIAGNOSIS — N183 Chronic kidney disease, stage 3 (moderate): Secondary | ICD-10-CM

## 2015-04-07 LAB — GLUCOSE, CAPILLARY
GLUCOSE-CAPILLARY: 123 mg/dL — AB (ref 65–99)
Glucose-Capillary: 83 mg/dL (ref 65–99)
Glucose-Capillary: 112 mg/dL — ABNORMAL HIGH (ref 65–99)
Glucose-Capillary: 132 mg/dL — ABNORMAL HIGH (ref 65–99)

## 2015-04-07 LAB — COMPREHENSIVE METABOLIC PANEL
ALBUMIN: 3.6 g/dL (ref 3.5–5.0)
ALT: 14 U/L — AB (ref 17–63)
AST: 28 U/L (ref 15–41)
Alkaline Phosphatase: 56 U/L (ref 38–126)
Anion gap: 6 (ref 5–15)
BUN: 21 mg/dL — AB (ref 6–20)
CHLORIDE: 110 mmol/L (ref 101–111)
CO2: 26 mmol/L (ref 22–32)
CREATININE: 1.01 mg/dL (ref 0.61–1.24)
Calcium: 8.9 mg/dL (ref 8.9–10.3)
GFR calc non Af Amer: >60 mL/min (ref >60)
Glucose, Bld: 81 mg/dL (ref 65–99)
Potassium: 3.8 mmol/L (ref 3.5–5.1)
SODIUM: 142 mmol/L (ref 135–145)
Total Bilirubin: 1.2 mg/dL (ref 0.3–1.2)
Total Protein: 6.4 g/dL — ABNORMAL LOW (ref 6.5–8.1)

## 2015-04-07 LAB — CBC
HCT: 36.4 % — ABNORMAL LOW (ref 39.0–52.0)
Hemoglobin: 12.7 g/dL — ABNORMAL LOW (ref 13.0–17.0)
MCH: 35.6 pg — ABNORMAL HIGH (ref 26.0–34.0)
MCHC: 34.9 g/dL (ref 30.0–36.0)
MCV: 102 fL — ABNORMAL HIGH (ref 78.0–100.0)
PLATELETS: 149 10*3/uL — AB (ref 150–400)
RBC: 3.57 MIL/uL — AB (ref 4.22–5.81)
RDW: 13.1 % (ref 11.5–15.5)
WBC: 6.5 10*3/uL (ref 4.0–10.5)

## 2015-04-07 LAB — HEMOGLOBIN A1C
HEMOGLOBIN A1C: 5.7 % — AB (ref 4.8–5.6)
MEAN PLASMA GLUCOSE: 117 mg/dL

## 2015-04-07 LAB — TROPONIN I

## 2015-04-07 NOTE — Progress Notes (Signed)
Patient ID: Kevin Dawson, male   DOB: 27-Apr-1927, 82 y.o.   MRN: [id number]  [facility] PROGRESS NOTE  JEFERSON BOOZER WUJ:[id number] DOB: November 18, 1927 DOA: 04/06/2015 PCP: Florentina Jenny, MD   Brief narrative:    82 yr old male with history of dementia, anemia, diabetes, prior history of alcohol abuse with no ongoing use of alcohol at this time (verified with the nursing home) , who was brought into the ER because he was walking down the hall at his memory care unit, all covered in blood and his underware was full of blood.    Assessment/Plan:    Rectal bleeding - hemoglobin appears to be stable - EDP discussed with Dr. Ewing Schlein from gastroenterology, given his dementia he's not a candidate for invasive workup,  - GI recommended a nuclear medicine scan if the patient continues to have recurrent bleeding with potential IR intervention - continue PPI  - CBC in AM  Atrial fibrillation - rate controlled, continue beta blocker, patient is not on anticoagulation given advanced age and high risk bleed   Dementia - continue dementia medications  Hypertension, essential  - continue metoprolol  DM (diabetes mellitus) (HCC) - continue sliding scale insulin until oral intake improves   Chronic kidney disease stage II - kidney function appears to be at baseline  DVT prophylaxis - SCD's  Code Status: DNR Family Communication:  plan of care discussed with the patient Disposition Plan: SNF by 2/8  IV access:  Peripheral IV  Procedures and diagnostic studies:    No results found.  Medical Consultants:  None  Other Consultants:  None  IAnti-Infectives:   None  Debbora Presto, MD  [facility] Pager [phone]  If 7PM-7AM, please contact night-coverage [email or web address] Password [email or web address] 04/07/2015, 6:41 PM   LOS: 1 day   HPI/Subjective: No events overnight.   Objective: Filed Vitals:   04/07/15 1500 04/07/15 1600 04/07/15 1700 04/07/15 1800  BP: 159/62   160/75  Pulse:       Temp:  98.2 F (36.8 C)    TempSrc:  Oral    Resp: 16   11  Height:      Weight:      SpO2:   100%     Intake/Output Summary (Last 24 hours) at 04/07/15 1841 Last data filed at 04/07/15 1800  Gross per 24 hour  Intake   1840 ml  Output   2450 ml  Net   -610 ml    Exam:   General:  Pt is alert, follows commands appropriately, not in acute distress  Cardiovascular: Regular rate and rhythm, no rubs, no gallops  Respiratory: Clear to auscultation bilaterally, no wheezing, no crackles, no rhonchi  Abdomen: Soft, non tender, non distended, bowel sounds present, no guarding  Data Reviewed: Basic Metabolic Panel:  Recent Labs Lab 04/06/15 1039 04/06/15 1056 04/07/15 0340  NA 142 144 142  K 4.3 4.2 3.8  CL 109 106 110  CO2 27  --  26  GLUCOSE 154* 143* 81  BUN 27* 29* 21*  CREATININE 1.29* 1.30* 1.01  CALCIUM 9.1  --  8.9   Liver Function Tests:  Recent Labs Lab 04/06/15 1039 04/07/15 0340  AST 26 28  ALT 16* 14*  ALKPHOS 59 56  BILITOT 0.7 1.2  PROT 6.5 6.4*  ALBUMIN 3.5 3.6   No results for input(s): LIPASE, AMYLASE in the last 168 hours. No results for input(s): AMMONIA in the last 168 hours. CBC:  Recent Labs Lab 04/06/15 1039 04/06/15 1056  04/06/15 1304 04/06/15 1956 04/07/15 0340  WBC 5.6  --  5.4 6.1 6.5  NEUTROABS 3.9  --   --   --   --   HGB 13.4 13.3 12.8* 12.1* 12.7*  HCT 38.4* 39.0 37.7* 34.9* 36.4*  MCV 101.3*  --  103.3* 100.6* 102.0*  PLT 150  --  168 126* 149*   Cardiac Enzymes:  Recent Labs Lab 04/06/15 1304 04/06/15 1956 04/07/15 0339  TROPONINI <0.03 <0.03 <0.03   BNP: Invalid input(s): POCBNP CBG:  Recent Labs Lab 04/06/15 1621 04/07/15 0753  GLUCAP 80 83    Recent Results (from the past 240 hour(s))  MRSA PCR Screening     Status: Abnormal   Collection Time: 04/06/15  2:03 PM  Result Value Ref Range Status   MRSA by PCR POSITIVE (A) NEGATIVE Final    Comment:        The GeneXpert MRSA Assay  (FDA approved for NASAL specimens only), is one component of a comprehensive MRSA colonization surveillance program. It is not intended to diagnose MRSA infection nor to guide or monitor treatment for MRSA infections. RESULT CALLED TO, READ BACK BY AND VERIFIED WITH: KOONTZ,A AT 1600 ON [id number] BY HOOKER,B      Scheduled Meds: . antiseptic oral rinse  7 mL Mouth Rinse BID  . aspirin EC  81 mg Oral Daily  . docusate sodium  100 mg Oral QHS  . galantamine  4 mg Oral BID WC  . insulin aspart  0-9 Units Subcutaneous TID WC  . pantoprazole (PROTONIX) IV  40 mg Intravenous Q12H  . polyethylene glycol  17 g Oral Daily  . sodium chloride flush  3 mL Intravenous Q12H   Continuous Infusions: . sodium chloride 75 mL/hr at 04/07/15 0308

## 2015-04-07 NOTE — Care Management Note (Signed)
Case Management Note  Patient Details  Name: Kevin Dawson MRN: [id number] Date of Birth: 01-27-1928  Subjective/Objective:            Actively having rectal bleeding at the nsg home and upon arrival        Action/Plan:Date: April 07, 2015 Chart reviewed for concurrent status and case management needs. Will continue to follow patient for changes and needs: Marcelle Smiling, BSN, RN, [place]   [phone]   Expected Discharge Date:                  Expected Discharge Plan:  Skilled Nursing Facility  In-House Referral:  Clinical Social Work  Discharge planning Services  CM Consult  Post Acute Care Choice:  NA Choice offered to:  NA  DME Arranged:    DME Agency:     HH Arranged:    HH Agency:     Status of Service:  Completed, signed off  Medicare Important Message Given:    Date Medicare IM Given:    Medicare IM give by:    Date Additional Medicare IM Given:    Additional Medicare Important Message give by:     If discussed at Long Length of Stay Meetings, dates discussed:    Additional Comments:  Golda Acre, RN 04/07/2015, 2:10 PM

## 2015-04-08 ENCOUNTER — Encounter (HOSPITAL_COMMUNITY): Payer: Self-pay

## 2015-04-08 ENCOUNTER — Inpatient Hospital Stay (HOSPITAL_COMMUNITY): Payer: Medicare Other

## 2015-04-08 DIAGNOSIS — E1121 Type 2 diabetes mellitus with diabetic nephropathy: Secondary | ICD-10-CM | POA: Diagnosis present

## 2015-04-08 DIAGNOSIS — N179 Acute kidney failure, unspecified: Secondary | ICD-10-CM | POA: Diagnosis present

## 2015-04-08 DIAGNOSIS — R0902 Hypoxemia: Secondary | ICD-10-CM | POA: Diagnosis present

## 2015-04-08 DIAGNOSIS — I4891 Unspecified atrial fibrillation: Secondary | ICD-10-CM | POA: Diagnosis present

## 2015-04-08 DIAGNOSIS — I10 Essential (primary) hypertension: Secondary | ICD-10-CM | POA: Diagnosis present

## 2015-04-08 DIAGNOSIS — E870 Hyperosmolality and hypernatremia: Secondary | ICD-10-CM | POA: Diagnosis not present

## 2015-04-08 DIAGNOSIS — N183 Chronic kidney disease, stage 3 (moderate): Secondary | ICD-10-CM

## 2015-04-08 DIAGNOSIS — D696 Thrombocytopenia, unspecified: Secondary | ICD-10-CM | POA: Diagnosis not present

## 2015-04-08 LAB — CBC
HCT: 34.4 % — ABNORMAL LOW (ref 39.0–52.0)
HEMOGLOBIN: 11.8 g/dL — AB (ref 13.0–17.0)
MCH: 35.1 pg — AB (ref 26.0–34.0)
MCHC: 34.3 g/dL (ref 30.0–36.0)
MCV: 102.4 fL — ABNORMAL HIGH (ref 78.0–100.0)
PLATELETS: 138 10*3/uL — AB (ref 150–400)
RBC: 3.36 MIL/uL — ABNORMAL LOW (ref 4.22–5.81)
RDW: 13.3 % (ref 11.5–15.5)
WBC: 5.1 10*3/uL (ref 4.0–10.5)

## 2015-04-08 LAB — GLUCOSE, CAPILLARY
GLUCOSE-CAPILLARY: 93 mg/dL (ref 65–99)
GLUCOSE-CAPILLARY: 128 mg/dL — AB (ref 65–99)
GLUCOSE-CAPILLARY: 138 mg/dL — AB (ref 65–99)
GLUCOSE-CAPILLARY: 108 mg/dL — AB (ref 65–99)
Glucose-Capillary: 78 mg/dL (ref 65–99)

## 2015-04-08 LAB — BASIC METABOLIC PANEL
Anion gap: 10 (ref 5–15)
BUN: 16 mg/dL (ref 6–20)
CALCIUM: 8.7 mg/dL — AB (ref 8.9–10.3)
CHLORIDE: 112 mmol/L — AB (ref 101–111)
CO2: 24 mmol/L (ref 22–32)
CREATININE: 1.09 mg/dL (ref 0.61–1.24)
GFR calc non Af Amer: 60 mL/min — ABNORMAL LOW (ref >60)
Glucose, Bld: 93 mg/dL (ref 65–99)
Potassium: 3.4 mmol/L — ABNORMAL LOW (ref 3.5–5.1)
SODIUM: 146 mmol/L — AB (ref 135–145)

## 2015-04-08 MED ORDER — CHLORHEXIDINE GLUCONATE CLOTH 2 % EX PADS
6.0000 | MEDICATED_PAD | Freq: Every day | CUTANEOUS | Status: DC
  Administered 2015-04-09 – 2015-04-10 (×2): 6 via TOPICAL

## 2015-04-08 MED ORDER — POTASSIUM CHLORIDE CRYS ER 20 MEQ PO TBCR
40.0000 meq | EXTENDED_RELEASE_TABLET | Freq: Once | ORAL | Status: AC
  Administered 2015-04-08: 40 meq via ORAL
  Filled 2015-04-08: qty 2

## 2015-04-08 MED ORDER — IPRATROPIUM-ALBUTEROL 0.5-2.5 (3) MG/3ML IN SOLN: 3.0000 mL | RESPIRATORY_TRACT | Status: DC | PRN

## 2015-04-08 MED ORDER — MUPIROCIN 2 % EX OINT
1.0000 "application " | TOPICAL_OINTMENT | Freq: Two times a day (BID) | CUTANEOUS | Status: DC
  Administered 2015-04-08 – 2015-04-11 (×7): 1 via NASAL
  Filled 2015-04-08: qty 22

## 2015-04-08 NOTE — Progress Notes (Signed)
CSW assisting with d/c planning. Pt is from [facility] unit. Clinical updates have been sent to facility for review. Memory Care unit manager reports that pt was not using oxygen prior to hospital admission. CSW will continue to follow this pt to assist with d/c planning needs.  Cori Razor LCSW [phone]

## 2015-04-08 NOTE — Discharge Instructions (Signed)

## 2015-04-08 NOTE — Progress Notes (Signed)
Patient ID: Kevin Dawson, male   DOB: 07/23/1927, 82 y.o.   MRN: [id number]  [facility] PROGRESS NOTE  Kevin Dawson BJY:[id number] DOB: [birth year] DOA: 04/06/2015 PCP: Florentina Jenny, MD   Brief narrative:    82 yr old male with history of dementia, anemia, diabetes, prior history of alcohol abuse with no ongoing use of alcohol at this time (verified with the nursing home) , who was brought into the ER because he was walking down the hall at his memory care unit, all covered in blood and his underware was full of blood.    Assessment/Plan:    Rectal bleeding - hemoglobin appears to be stable - EDP discussed with Dr. Ewing Schlein from gastroenterology, given his dementia he's not a candidate for invasive workup,  - GI recommended a nuclear medicine scan if the patient continues to have recurrent bleeding with potential IR intervention - Hg is down a bit since admission but no signs of bleeding  - CBC in AM  Hypoxia - unable to wean off of oxygen  - pt denies dyspnea or chest pain - will ask for CT chest for clearer evaluation   Thrombocytopenia - mild, no signs of bleeding reported - CBC in AM  Hypernatremia - likely from poor oral intake - had good breakfast this AM - BMP in AM  Hypokalemia - mild - supplement and repeat BMP in AM  Atrial fibrillation, CHADS2 score 3-4  - rate controlled, continue beta blocker, patient is not on anticoagulation given advanced age and high risk bleed   Dementia - continue dementia medications - high aspiration risk, aspiration precautions   Hypertension, essential  - continue metoprolol  DM (diabetes mellitus) (HCC) with complications of CKD - continue sliding scale insulin until oral intake improves   Chronic kidney disease stage II - kidney function appears to be at baseline - BMP In AM  DVT prophylaxis - SCD's  Code Status: DNR Family Communication:  plan of care discussed with the patient Disposition Plan: SNF by  2/9  IV access:  Peripheral IV  Procedures and diagnostic studies:    No results found.  Medical Consultants:  SLP  Other Consultants:  None  IAnti-Infectives:   None  Debbora Presto, MD  [facility] Pager [phone]  If 7PM-7AM, please contact night-coverage [email or web address] Password [email or web address] 04/08/2015, 5:11 PM   LOS: 2 days   HPI/Subjective: No events overnight.   Objective: Filed Vitals:   04/08/15 1200 04/08/15 1246 04/08/15 1500 04/08/15 1600  BP: 168/58   131/113  Pulse:      Temp:  97.7 F (36.5 C) 97.7 F (36.5 C)   TempSrc:  Oral    Resp: 17   20  Height:      Weight:      SpO2: 98%   100%    Intake/Output Summary (Last 24 hours) at 04/08/15 1711 Last data filed at 04/08/15 1600  Gross per 24 hour  Intake   2430 ml  Output   4450 ml  Net  -2020 ml    Exam:   General:  Pt is alert, not in acute distress, answers yet to most questions   Cardiovascular: Regular rate and rhythm, no rubs, no gallops  Respiratory: Clear to auscultation bilaterally, rhonchi at bases   Abdomen: Soft, non tender, non distended, bowel sounds present, no guarding  Data Reviewed: Basic Metabolic Panel:  Recent Labs Lab 04/06/15 1039 04/06/15 1056 04/07/15 0340 04/08/15 0340  NA 142 144 142 146*  K 4.3 4.2  3.8 3.4*  CL 109 106 110 112*  CO2 27  --  26 24  GLUCOSE 154* 143* 81 93  BUN 27* 29* 21* 16  CREATININE 1.29* 1.30* 1.01 1.09  CALCIUM 9.1  --  8.9 8.7*   Liver Function Tests:  Recent Labs Lab 04/06/15 1039 04/07/15 0340  AST 26 28  ALT 16* 14*  ALKPHOS 59 56  BILITOT 0.7 1.2  PROT 6.5 6.4*  ALBUMIN 3.5 3.6   CBC:  Recent Labs Lab 04/06/15 1039 04/06/15 1056 04/06/15 1304 04/06/15 1956 04/07/15 0340 04/08/15 0340  WBC 5.6  --  5.4 6.1 6.5 5.1  NEUTROABS 3.9  --   --   --   --   --   HGB 13.4 13.3 12.8* 12.1* 12.7* 11.8*  HCT 38.4* 39.0 37.7* 34.9* 36.4* 34.4*  MCV 101.3*  --  103.3* 100.6* 102.0* 102.4*  PLT 150  --  168 126* 149* 138*    Cardiac Enzymes:  Recent Labs Lab 04/06/15 1304 04/06/15 1956 04/07/15 0339  TROPONINI <0.03 <0.03 <0.03   BNP: Invalid input(s): POCBNP CBG:  Recent Labs Lab 04/07/15 2250 04/08/15 0409 04/08/15 0734 04/08/15 1124 04/08/15 1519  GLUCAP 123* 78 93 128* 138*    Recent Results (from the past 240 hour(s))  MRSA PCR Screening     Status: Abnormal   Collection Time: 04/06/15  2:03 PM  Result Value Ref Range Status   MRSA by PCR POSITIVE (A) NEGATIVE Final     Scheduled Meds: . antiseptic oral rinse  7 mL Mouth Rinse BID  . aspirin EC  81 mg Oral Daily  . [START ON 04/09/2015] Chlorhexidine Gluconate Cloth  6 each Topical Q0600  . docusate sodium  100 mg Oral QHS  . galantamine  4 mg Oral BID WC  . insulin aspart  0-9 Units Subcutaneous TID WC  . mupirocin ointment  1 application Nasal BID  . pantoprazole (PROTONIX) IV  40 mg Intravenous Q12H  . polyethylene glycol  17 g Oral Daily  . sodium chloride flush  3 mL Intravenous Q12H   Continuous Infusions:

## 2015-04-09 ENCOUNTER — Encounter (HOSPITAL_COMMUNITY): Payer: Self-pay | Admitting: *Deleted

## 2015-04-09 DIAGNOSIS — K922 Gastrointestinal hemorrhage, unspecified: Secondary | ICD-10-CM | POA: Insufficient documentation

## 2015-04-09 DIAGNOSIS — Z515 Encounter for palliative care: Secondary | ICD-10-CM | POA: Insufficient documentation

## 2015-04-09 DIAGNOSIS — Z7189 Other specified counseling: Secondary | ICD-10-CM | POA: Insufficient documentation

## 2015-04-09 LAB — CBC
HCT: 34.5 % — ABNORMAL LOW (ref 39.0–52.0)
Hemoglobin: 11.9 g/dL — ABNORMAL LOW (ref 13.0–17.0)
MCH: 35.2 pg — AB (ref 26.0–34.0)
MCHC: 34.5 g/dL (ref 30.0–36.0)
MCV: 102.1 fL — ABNORMAL HIGH (ref 78.0–100.0)
PLATELETS: 139 10*3/uL — AB (ref 150–400)
RBC: 3.38 MIL/uL — AB (ref 4.22–5.81)
RDW: 13.3 % (ref 11.5–15.5)
WBC: 5.3 10*3/uL (ref 4.0–10.5)

## 2015-04-09 LAB — BASIC METABOLIC PANEL
ANION GAP: 8 (ref 5–15)
BUN: 11 mg/dL (ref 6–20)
CO2: 24 mmol/L (ref 22–32)
Calcium: 9 mg/dL (ref 8.9–10.3)
Chloride: 113 mmol/L — ABNORMAL HIGH (ref 101–111)
Creatinine, Ser: 1.13 mg/dL (ref 0.61–1.24)
GFR calc Af Amer: >60 mL/min (ref >60)
GFR, EST NON AFRICAN AMERICAN: 57 mL/min — AB (ref >60)
GLUCOSE: 105 mg/dL — AB (ref 65–99)
POTASSIUM: 3.7 mmol/L (ref 3.5–5.1)
Sodium: 145 mmol/L (ref 135–145)

## 2015-04-09 LAB — GLUCOSE, CAPILLARY
GLUCOSE-CAPILLARY: 133 mg/dL — AB (ref 65–99)
GLUCOSE-CAPILLARY: 137 mg/dL — AB (ref 65–99)
Glucose-Capillary: 91 mg/dL (ref 65–99)
Glucose-Capillary: 106 mg/dL — ABNORMAL HIGH (ref 65–99)

## 2015-04-09 NOTE — Consult Note (Signed)
Consultation Note Date: 04/09/2015   Patient Name: Kevin Dawson  DOB: 04/21/1927  MRN: [id number]  Age / Sex: 82 y.o., male  PCP: Florentina Jenny, MD Referring Physician: Dorothea Ogle, MD  Reason for Consultation: Establishing goals of care    Clinical Assessment/Narrative:  Patient is a 82 year old gentleman with a past medical history significant for dementia, atrial fibrillation, diabetes, hypertension. Patient is a ward of the state and has a legal guardian. Patient resides at [facility] memory care unit. Patient was brought into the hospital with confusion and possible GI bleed. The patient is deemed not an appropriate candidate for any kind of endoscopy workup owing to his underlying advanced dementia. The patient's serial hemoglobin are being monitored. Supportive care for GI bleed is being given. Speech therapy has also seen the patient and has recommended aspiration precautions and dietary restrictions. Patient was noted to have hypoxia/O2 requirements. CT scan of the chest was done which shows nodule/cavitation.  Palliative care consultation for goals of care discussions.  Physical therapy evaluation is pending  Patient is awake alert resting in bed. He yells out periodically. He is able to answer very simple yes/no type questions but is otherwise unable to provide full aspect of history taking. Hospital course also complicated by hypernatremia.  Call placed and discussed with the patient's legal guardian Marcelino Duster at [phone]. It is known that the patient is a ward of the state since 2005. There are no family members who can have information pertaining to the patient's current condition. Discussed with Marcelino Duster about the patient's current hospitalization. Discussed about life limiting condition such as recent gastrointestinal bleed, possible nodule/cavitation ? Neoplasm in the lungs. Discussed underlying  dementia, atrial fibrillation.  CODE STATUS reconfirmed as DO NOT RESUSCITATE/DO NOT INTUBATE. Discussed about recommendation for addition of hospice as an extra layer of support at the patient's memory care unit.  Discussed extensively with Marcelino Duster. She is agreeable to recommendations put forth.  Awaiting physical therapy evaluation. Questionable transfer to skilled nursing facility versus back to [facility] soon.  Contacts/Participants in Discussion: Primary Decision Maker:     Relationship to Patient   HCPOA: yes     SUMMARY OF RECOMMENDATIONS: DO NOT RESUSCITATE/DO NOT INTUBATE CODE STATUS reconfirmed with legal guardian Marcelino Duster over the phone at [phone] Hospice consult Discharge to skilled nursing facility with hospice or memory care unit with hospice  Code Status/Advance Care Planning: DNR    Code Status Orders        Start     Ordered   04/06/15 1245  Do not attempt resuscitation (DNR)   Continuous    Question Answer Comment  In the event of cardiac or respiratory ARREST Do not call a "code blue"   In the event of cardiac or respiratory ARREST Do not perform Intubation, CPR, defibrillation or ACLS   In the event of cardiac or respiratory ARREST Use medication by any route, position, wound care, and other measures to relive pain and suffering. May use oxygen, suction and manual treatment of airway obstruction as needed for comfort.      04/06/15 1245    Code Status History    Date Active Date Inactive Code Status Order ID Comments User Context   02/09/2012  7:17 PM 02/11/2012  6:54 PM Full Code [id number]  Leonides Cave, RN ED      Other Directives:Other  Symptom Management:    as above   Palliative Prophylaxis:   Delirium Protocol  Additional Recommendations (Limitations, Scope, Preferences):  Recommend SNF with Hospice, discussed with DSS guardian.    Psycho-social/Spiritual:  Support System: Fair Desire for further Chaplaincy  support:no Additional Recommendations: Education on Hospice  Prognosis: ? Weeks to months.   Discharge Planning: Skilled Nursing Facility with Hospice   Chief Complaint/ Primary Diagnoses: Present on Admission:  . Dementia . GI bleed . Pressure ulcer . Acute renal failure superimposed on stage 3 chronic kidney disease (HCC) . Diabetes mellitus with nephropathy (HCC) . Essential hypertension . Atrial fibrillation (HCC) . Hypoxia  I have reviewed the medical record, interviewed the patient and family, and examined the patient. The following aspects are pertinent.  Past Medical History  Diagnosis Date  . Dementia   . Anemia   . Diabetes mellitus without complication (HCC)   . Alcoholic psychosis (HCC)   . Alcohol abuse   . Subtrochanteric fracture [facility])    Social History   Social History  . Marital Status: Widowed    Spouse Name: N/A  . Number of Children: N/A  . Years of Education: N/A   Social History Main Topics  . Smoking status: Unknown If Ever Smoked  . Smokeless tobacco: None  . Alcohol Use: No  . Drug Use: No  . Sexual Activity: No   Other Topics Concern  . None   Social History Narrative   Family History  Problem Relation Age of Onset  . Other Mother   . Hypertension Mother   . Other Father   . Hypertension Father   . Diabetes Father    Scheduled Meds: . antiseptic oral rinse  7 mL Mouth Rinse BID  . aspirin EC  81 mg Oral Daily  . Chlorhexidine Gluconate Cloth  6 each Topical Q0600  . docusate sodium  100 mg Oral QHS  . galantamine  4 mg Oral BID WC  . insulin aspart  0-9 Units Subcutaneous TID WC  . mupirocin ointment  1 application Nasal BID  . pantoprazole (PROTONIX) IV  40 mg Intravenous Q12H  . polyethylene glycol  17 g Oral Daily  . sodium chloride flush  3 mL Intravenous Q12H   Continuous Infusions:  PRN Meds:.acetaminophen **OR** acetaminophen, levalbuterol, ondansetron **OR** ondansetron (ZOFRAN) IV, oxyCODONE, RESOURCE THICKENUP  CLEAR Medications Prior to Admission:  Prior to Admission medications   Medication Sig Start Date End Date Taking? Authorizing Provider  acetaminophen (TYLENOL) 325 MG tablet Take 650 mg by mouth every 6 (six) hours as needed for mild pain.   Yes Historical Provider, MD  Artificial Tear Ointment (ARTIFICIAL TEARS) ointment Place 1 application into both eyes at bedtime.   Yes Historical Provider, MD  aspirin EC 81 MG tablet Take 81 mg by mouth daily.   Yes Historical Provider, MD  docusate sodium (COLACE) 100 MG capsule Take 100 mg by mouth at bedtime.    Yes Historical Provider, MD  galantamine (RAZADYNE ER) 8 MG 24 hr capsule Take 8 mg by mouth daily with breakfast.   Yes Historical Provider, MD  Ketotifen Fumarate (THERA TEARS ALLERGY OP) Apply 1 drop to eye 4 (four) times daily.   Yes Historical Provider, MD  losartan (COZAAR) 25 MG tablet Take 25 mg by mouth daily.   Yes Historical Provider, MD  metoprolol tartrate (LOPRESSOR) 25 MG tablet Take 12.5 mg by mouth daily.   Yes Historical Provider, MD  Multiple Vitamin (DAILY VITE PO) Take 1 tablet by mouth daily.   Yes Historical Provider, MD  polyethylene glycol (MIRALAX / GLYCOLAX) packet Take 17 g by mouth daily.  Yes Historical Provider, MD  ranitidine (ZANTAC) 150 MG tablet Take 150 mg by mouth at bedtime.   Yes Historical Provider, MD  ipratropium-albuterol (DUONEB) 0.5-2.5 (3) MG/3ML SOLN Take 3 mLs by nebulization every 4 (four) hours as needed. 04/08/15   Dorothea Ogle, MD  metoprolol (LOPRESSOR) 50 MG tablet Take 0.5 tablets (25 mg total) by mouth 2 (two) times daily. Patient not taking: Reported on 04/06/2015 02/11/12   Standley Brooking, MD   No Known Allergies  Review of Systems Does not verbalize much  Physical Exam Weak pale appearing elderly gentleman S1 S2 Clear Abdomen soft No edema Awake alert. Answers few questions minimally  Vital Signs: BP 147/73 mmHg  Pulse 70  Temp(Src) 98.9 F (37.2 C) (Oral)  Resp 20  Ht  6' (1.829 m)  Wt 78.8 kg (173 lb 11.6 oz)  BMI 23.56 kg/m2  SpO2 98%  SpO2: SpO2: 98 % O2 Device:SpO2: 98 % O2 Flow Rate: .O2 Flow Rate (L/min): 4 L/min  IO: Intake/output summary:  Intake/Output Summary (Last 24 hours) at 04/09/15 1535 Last data filed at 04/09/15 0544  Gross per 24 hour  Intake    110 ml  Output   1200 ml  Net  -1090 ml    LBM: Last BM Date:  (0207) Baseline Weight: Weight: 78.8 kg (173 lb 11.6 oz) Most recent weight: Weight: 78.8 kg (173 lb 11.6 oz)      Palliative Assessment/Data:  Flowsheet Rows        Most Recent Value   Intake Tab    Referral Department  Hospitalist   Unit at Time of Referral  Med/Surg Unit   Palliative Care Primary Diagnosis  Neurology   Date Notified  04/08/15   Palliative Care Type  New Palliative care   Reason for referral  Non-pain Symptom, Clarify Goals of Care, Counsel Regarding Hospice   Date of Admission  04/06/15   Date first seen by Palliative Care  04/09/15   # of days IP prior to Palliative referral  2   Clinical Assessment    Palliative Performance Scale Score  30%   Pain Max last 24 hours  3   Pain Min Last 24 hours  2   Dyspnea Max Last 24 Hours  2   Dyspnea Min Last 24 hours  1   Psychosocial & Spiritual Assessment    Palliative Care Outcomes    Patient/Family meeting held?  Yes   Who was at the meeting?  DSS guardian Roxana Hires at [phone]    Palliative Care Outcomes  Clarified goals of care, Counseled regarding hospice   Palliative Care follow-up planned  Yes, Facility      Additional Data Reviewed:  CBC:    Component Value Date/Time   WBC 5.3 04/09/2015 0429   HGB 11.9* 04/09/2015 0429   HCT 34.5* 04/09/2015 0429   PLT 139* 04/09/2015 0429   MCV 102.1* 04/09/2015 0429   NEUTROABS 3.9 04/06/2015 1039   LYMPHSABS 1.1 04/06/2015 1039   MONOABS 0.5 04/06/2015 1039   EOSABS 0.2 04/06/2015 1039   BASOSABS 0.0 04/06/2015 1039   Comprehensive Metabolic Panel:    Component Value Date/Time    NA 145 04/09/2015 0429   K 3.7 04/09/2015 0429   CL 113* 04/09/2015 0429   CO2 24 04/09/2015 0429   BUN 11 04/09/2015 0429   CREATININE 1.13 04/09/2015 0429   GLUCOSE 105* 04/09/2015 0429   CALCIUM 9.0 04/09/2015 0429   AST 28 04/07/2015 0340  ALT 14* 04/07/2015 0340   ALKPHOS 56 04/07/2015 0340   BILITOT 1.2 04/07/2015 0340   PROT 6.4* 04/07/2015 0340   ALBUMIN 3.6 04/07/2015 0340     Time In: 2 Time Out: 3 Time Total: 60 min  Greater than 50%  of this time was spent counseling and coordinating care related to the above assessment and plan.  Signed by: Rosalin Hawking, MD [id number] Rosalin Hawking, MD  04/09/2015, 3:35 PM  Please contact Palliative Medicine Team phone at [phone] for questions and concerns.

## 2015-04-09 NOTE — Progress Notes (Signed)
Pt transfer from ICU/SDU.  CSW received notification that pt admitted from [facility] unit.  CSW contacted [facility] unit manager who reports that pt has legal guardian through [facility], Rosalyn Gess [phone]).  Memory Care unit manager states that pt was ambulatory with a walker prior to admission and pt was not using oxygen prior to hospital admission. Memory Care unit manager states that hospital needs to ensure pt still ambulatory as memory care unit is on 4th floor of the building and they do not allow wheelchairs on that floor of the building.  PT evaluation requested from MD to ensure pt needs can continue to be met at [facility] memory care.   CSW contacted pt legal guardian through [facility], Rosalyn Gess [phone]) and left voice message. CSW to complete full psychosocial assessment when able to speak with DSS guardian.  CSW to continue to follow.  Alison Murray, MSW, [place] [employer] [phone]

## 2015-04-09 NOTE — Care Management Important Message (Signed)
Important Message  Patient Details  Name: ESGAR BARNICK MRN: [id number] Date of Birth: 1927-12-08   Medicare Important Message Given:  Yes    Haskell Flirt 04/09/2015, 10:50 AMImportant Message  Patient Details  Name: DAMARIS GEERS MRN: [id number] Date of Birth: May 28, 1927   Medicare Important Message Given:  Yes    Haskell Flirt 04/09/2015, 10:50 AM

## 2015-04-09 NOTE — Progress Notes (Signed)
Patient ID: Kevin Dawson, male   DOB: Jun 01, 1927, 82 y.o.   MRN: [id number]  [facility] PROGRESS NOTE  Kevin Dawson WUJ:[id number] DOB: Jan 31, 1928 DOA: 04/06/2015 PCP: Florentina Jenny, MD   Brief narrative:    82 yr old male with history of dementia, anemia, diabetes, prior history of alcohol abuse with no ongoing use of alcohol at this time (verified with the nursing home) , who was brought into the ER because he was walking down the hall at his memory care unit, all covered in blood and his underware was full of blood.    Assessment/Plan:    Rectal bleeding - hemoglobin appears to be stable - EDP discussed with Dr. Ewing Schlein from gastroenterology, given his dementia he's not a candidate for invasive workup,  - GI recommended a nuclear medicine scan if the patient continues to have recurrent bleeding with potential IR intervention - Hg ovearll stable since admission with no signs of active bleeding   Hypoxia - unable to wean off of oxygen  - pt denies dyspnea or chest pain - CT chest with small bilateral pleural effusions, bilateral dependent atelectasis, 7 mm semi-solid nodule in the inferior right upper lobe, 10 mm area of cavitation with mild, mildly irregular diffuse wall thickening - ? chronically inflamed bulla or a small cavitary neoplasm - pt will likely require longer term oxygen via [place]  Thrombocytopenia - mild, no signs of bleeding reported - CBC in AM  Hypernatremia - likely from poor oral intake - resolved  - BMP in AM  Hypokalemia - mild - supplemented and WNL this AM  Atrial fibrillation, CHADS2 score 3-4  - rate controlled, continue beta blocker, patient is not on anticoagulation given advanced age and high risk bleed   Dementia - continue dementia medications - high aspiration risk, aspiration precautions   Hypertension, essential  - continue metoprolol  DM (diabetes mellitus) (HCC) with complications of CKD - continue sliding scale insulin  until oral intake improves   Chronic kidney disease stage II - kidney function appears to be at baseline - BMP In AM  Left hydronephrosis - will discuss with urology team - monitor urine output   DVT prophylaxis - SCD's  Code Status: DNR Family Communication:  plan of care discussed with the patient Disposition Plan: SNF by 2/9  IV access:  Peripheral IV  Procedures and diagnostic studies:    Ct Chest Wo Contrast 2015/04/14   Small bilateral pleural effusions with mild bilateral dependent atelectasis. 2. 7 mm semi-solid nodule in the inferior right upper lobe. 3. 10 mm area of cavitation with mild, mildly irregular diffuse wall thickening. This could represent a chronically inflamed bulla or a small cavitary neoplasm. A follow-up chest CT without contrast is recommended in 6 months. 4. Marked chronic left renal hydronephrosis with progression and associated marked diffuse left renal atrophy with progression. 5. Stable size of a large retroperitoneal fluid collection on the left with interval wall calcifications. 6. Atheromatous arterial calcifications, including the coronary arteries.   Medical Consultants:  SLP  Other Consultants:  None  IAnti-Infectives:   None  Debbora Presto, MD  [facility] Pager [phone]  If 7PM-7AM, please contact night-coverage [email or web address] Password [email or web address] 04/09/2015, 1:35 PM   LOS: 3 days   HPI/Subjective: No events overnight.   Objective: Filed Vitals:   2015-04-14 1600 14-Apr-2015 2050 04/14/2015 2220 04/09/15 0543  BP: 131/113 149/73  147/73  Pulse:   68 70  Temp:  97.5 F (36.4 C)  98.9 F (37.2 C)  TempSrc:  Oral  Oral  Resp: Height:      Weight:      SpO2: 100% 96% 98% 98%    Intake/Output Summary (Last 24 hours) at 04/09/15 1335 Last data filed at 04/09/15 0544  Gross per 24 hour  Intake    160 ml  Output   1750 ml  Net  -1590 ml    Exam:   General:  Pt is alert, not in acute distress, answers yet to most questions    Cardiovascular: Regular rate and rhythm, no rubs, no gallops  Respiratory: Clear to auscultation bilaterally, rhonchi at bases   Abdomen: Soft, non tender, non distended, bowel sounds present, no guarding  Data Reviewed: Basic Metabolic Panel:  Recent Labs Lab 04/06/15 1039 04/06/15 1056 04/07/15 0340 04/08/15 0340 04/09/15 0429  NA 142 144 142 146* 145  K 4.3 4.2 3.8 3.4* 3.7  CL 109 106 110 112* 113*  CO2 27  --  GLUCOSE 154* 143* 81 93 105*  BUN 27* 29* 21* 16 11  CREATININE 1.29* 1.30* 1.01 1.09 1.13  CALCIUM 9.1  --  8.9 8.7* 9.0   Liver Function Tests:  Recent Labs Lab 04/06/15 1039 04/07/15 0340  AST 26 28  ALT 16* 14*  ALKPHOS 59 56  BILITOT 0.7 1.2  PROT 6.5 6.4*  ALBUMIN 3.5 3.6   CBC:  Recent Labs Lab 04/06/15 1039  04/06/15 1304 04/06/15 1956 04/07/15 0340 04/08/15 0340 04/09/15 0429  WBC 5.6  --  5.4 6.1 6.5 5.1 5.3  NEUTROABS 3.9  --   --   --   --   --   --   HGB 13.4  < > 12.8* 12.1* 12.7* 11.8* 11.9*  HCT 38.4*  < > 37.7* 34.9* 36.4* 34.4* 34.5*  MCV 101.3*  --  103.3* 100.6* 102.0* 102.4* 102.1*  PLT 150  --  168 126* 149* 138* 139*  < > = values in this interval not displayed. Cardiac Enzymes:  Recent Labs Lab 04/06/15 1304 04/06/15 1956 04/07/15 0339  TROPONINI <0.03 <0.03 <0.03   BNP: Invalid input(s): POCBNP CBG:  Recent Labs Lab 04/08/15 0734 04/08/15 1124 04/08/15 1519 04/08/15 2100 04/09/15 0808  GLUCAP 93 128* 138* 108* 91    Recent Results (from the past 240 hour(s))  MRSA PCR Screening     Status: Abnormal   Collection Time: 04/06/15  2:03 PM  Result Value Ref Range Status   MRSA by PCR POSITIVE (A) NEGATIVE Final     Scheduled Meds: . antiseptic oral rinse  7 mL Mouth Rinse BID  . aspirin EC  81 mg Oral Daily  . Chlorhexidine Gluconate Cloth  6 each Topical Q0600  . docusate sodium  100 mg Oral QHS  . galantamine  4 mg Oral BID WC  . insulin aspart  0-9 Units Subcutaneous TID WC  .  mupirocin ointment  1 application Nasal BID  . pantoprazole (PROTONIX) IV  40 mg Intravenous Q12H  . polyethylene glycol  17 g Oral Daily  . sodium chloride flush  3 mL Intravenous Q12H   Continuous Infusions:

## 2015-04-09 NOTE — Evaluation (Signed)
Clinical/Bedside Swallow Evaluation Patient Details  Name: Kevin Dawson MRN: [id number] Date of Birth: 1927-03-07  Today's Date: 04/09/2015 Time: SLP Start Time (ACUTE ONLY): 1056 SLP Stop Time (ACUTE ONLY): 1114 SLP Time Calculation (min) (ACUTE ONLY): 18 min  Past Medical History:  Past Medical History  Diagnosis Date  . Dementia   . Anemia   . Diabetes mellitus without complication (HCC)   . Alcoholic psychosis (HCC)   . Alcohol abuse   . Subtrochanteric fracture [facility])    Past Surgical History:  Past Surgical History  Procedure Laterality Date  . Cholecystectomy    . Im nailing of left femur fracture    . Cystoscopy     HPI:  82 yr old male with history of dementia, anemia, diabetes, prior history of alcohol abuse with no ongoing use of alcohol at this time (verified with the nursing home) admitted with rectal bleeding, hypoxia, hypernatremia,hypokalemia.  Lungs are CTA, rhonchi at bases. Followed by SLP during 3/15 admission and dx with mild oropharyngeal dysphagia with no overt aspiration at the time.Thin liquids and soft diet were recommended after MBS 05/15/13.  CXR 2/7 with small bilateral pl effusions; 7 mm semi solid nodule inf RUL; area of cavitation with mild wall thickening.     Assessment / Plan / Recommendation Clinical Impression  Pt presents with functional oropharyngeal swallow which presents similarly to clinical findings from last assessment in March of 2015.  Pt with good attention to bolus material; he actively masticates; swallow response appears brisk; there were no overt s/s of aspiration with large, successive boluses of thin liquids. Pt is unable to follow directions or answer questions due to dementia, but he is participatory and pleasant. Recommend continuing a soft diet; allow thin liquids; meds whole in puree; provide assist with self-feeding but allow pt do as much as he can by himself.  SLP will f/u briefly to ensure toleration; D/W RN.     Aspiration  Risk  Mild aspiration risk    Diet Recommendation   soft diet; thin liquids  Medication Administration: Whole meds with puree    Other  Recommendations Oral Care Recommendations: Oral care BID   Follow up Recommendations  None    Frequency and Duration min 1 x/week  1 week       Prognosis Prognosis for Safe Diet Advancement: Good      Swallow Study   General Date of Onset:  (chronic) HPI: 82 yr old male with history of dementia, anemia, diabetes, prior history of alcohol abuse with no ongoing use of alcohol at this time (verified with the nursing home) admitted with rectal bleeding, hypoxia, hypernatremia,hypokalemia.  Lungs are CTA, rhonchi at bases. Followed by SLP during 3/15 admission and dx with mild oropharyngeal dysphagia with no overt aspiration at the time.Thin liquids and soft diet were recommended after MBS 05/15/13.  CXR 2/7 with small bilateral pl effusions; 7 mm semi solid nodule inf RUL; area of cavitation with mild wall thickening.   Type of Study: Bedside Swallow Evaluation Previous Swallow Assessment: 3 2015 see hx Temperature Spikes Noted: No Respiratory Status: Room air History of Recent Intubation: No Behavior/Cognition: Alert;Confused;Doesn't follow directions Oral Cavity Assessment: Within Functional Limits Oral Care Completed by SLP: No Oral Cavity - Dentition: Edentulous Vision: Impaired for self-feeding (tremor) Self-Feeding Abilities: Able to feed self;Needs assist Patient Positioning: Upright in bed Baseline Vocal Quality: Normal Volitional Cough: Cognitively unable to elicit Volitional Swallow: Unable to elicit    Oral/Motor/Sensory Function Overall Oral Motor/Sensory Function:  Within functional limits   Ice Chips Ice chips: Within functional limits   Thin Liquid Thin Liquid: Within functional limits Presentation: Cup;Self Fed    Nectar Thick Nectar Thick Liquid: Not tested   Honey Thick Honey Thick Liquid: Not tested   Puree Puree: Within  functional limits Presentation: Spoon   Solid   GO   Solid: Within functional limits Presentation: Spoon       Kevin Dawson L. Kevin Dawson, [place] CCC/SLP Pager [phone]  Blenda Mounts Kevin Dawson 04/09/2015,11:23 AM

## 2015-04-10 LAB — BASIC METABOLIC PANEL
Anion gap: 7 (ref 5–15)
BUN: 10 mg/dL (ref 6–20)
CHLORIDE: 112 mmol/L — AB (ref 101–111)
CO2: 26 mmol/L (ref 22–32)
CREATININE: 1.25 mg/dL — AB (ref 0.61–1.24)
Calcium: 9 mg/dL (ref 8.9–10.3)
GFR calc Af Amer: 59 mL/min — ABNORMAL LOW (ref >60)
GFR calc non Af Amer: 51 mL/min — ABNORMAL LOW (ref >60)
GLUCOSE: 103 mg/dL — AB (ref 65–99)
Potassium: 3.5 mmol/L (ref 3.5–5.1)
Sodium: 145 mmol/L (ref 135–145)

## 2015-04-10 LAB — CBC
HEMATOCRIT: 34.9 % — AB (ref 39.0–52.0)
HEMOGLOBIN: 11.9 g/dL — AB (ref 13.0–17.0)
MCH: 34.9 pg — AB (ref 26.0–34.0)
MCHC: 34.1 g/dL (ref 30.0–36.0)
MCV: 102.3 fL — ABNORMAL HIGH (ref 78.0–100.0)
Platelets: 143 10*3/uL — ABNORMAL LOW (ref 150–400)
RBC: 3.41 MIL/uL — ABNORMAL LOW (ref 4.22–5.81)
RDW: 13.4 % (ref 11.5–15.5)
WBC: 4.7 10*3/uL (ref 4.0–10.5)

## 2015-04-10 LAB — GLUCOSE, CAPILLARY
GLUCOSE-CAPILLARY: 105 mg/dL — AB (ref 65–99)
GLUCOSE-CAPILLARY: 115 mg/dL — AB (ref 65–99)
Glucose-Capillary: 89 mg/dL (ref 65–99)
Glucose-Capillary: 106 mg/dL — ABNORMAL HIGH (ref 65–99)

## 2015-04-10 MED ORDER — PANTOPRAZOLE SODIUM 40 MG PO TBEC
40.0000 mg | DELAYED_RELEASE_TABLET | Freq: Two times a day (BID) | ORAL | Status: DC
  Administered 2015-04-10 – 2015-04-11 (×3): 40 mg via ORAL
  Filled 2015-04-10 (×3): qty 1

## 2015-04-10 NOTE — NC FL2 (Signed)
[place] MEDICAID FL2 LEVEL OF CARE SCREENING TOOL     IDENTIFICATION  Patient Name: Kevin Dawson Birthdate: [birth year] Sex: male Admission Date (Current Location): 04/06/2015  [facility] and [place] Number:  Producer, television/film/video and Address:  [facility],  [address], [place] [address]      Provider Number: [id number]  Attending Physician Name and Address:  Dorothea Ogle, MD  Relative Name and Phone Number:  Elon Jester Juchatz/legal guardian/[phone]    Current Level of Care: Hospital Recommended Level of Care: Skilled Nursing Facility Prior Approval Number:    Date Approved/Denied:   PASRR Number: [id number]  Discharge Plan: SNF    Current Diagnoses: Patient Active Problem List   Diagnosis Date Noted  . Lower GI bleed   . Encounter for palliative care   . Goals of care, counseling/discussion   . Acute renal failure superimposed on stage 3 chronic kidney disease (HCC) 04/08/2015  . Diabetes mellitus with nephropathy (HCC) 04/08/2015  . Essential hypertension 04/08/2015  . Atrial fibrillation (HCC) 04/08/2015  . Thrombocytopenia (HCC) 04/08/2015  . Hypernatremia 04/08/2015  . Hypoxia 04/08/2015  . GI bleed 04/06/2015  . Pressure ulcer 04/06/2015  . Dementia 02/09/2012    Orientation RESPIRATION BLADDER Height & Weight      (disoriented x 4)  Normal Incontinent Weight: 173 lb 11.6 oz (78.8 kg) Height:  6' (182.9 cm)  BEHAVIORAL SYMPTOMS/MOOD NEUROLOGICAL BOWEL NUTRITION STATUS   (n/a)  (NONE) Incontinent Diet (Dysphagia 3 (mechanical soft);Thin liquid)  AMBULATORY STATUS COMMUNICATION OF NEEDS Skin   Limited Assist Non-Verbally PU Stage and Appropriate Care (Stage I to sacrum) PU Stage 1 Dressing: Daily                     Personal Care Assistance Level of Assistance  Bathing, Feeding, Dressing Bathing Assistance: Limited assistance Feeding assistance: Independent Dressing Assistance: Limited assistance     Functional  Limitations Info  Sight, Hearing, Speech Sight Info: Adequate Hearing Info: Adequate Speech Info: Impaired    SPECIAL CARE FACTORS FREQUENCY  PT (By licensed PT)     PT Frequency: 5 x a week              Contractures Contractures Info: Not present    Additional Factors Info  Code Status Code Status Info: FULL code status-documentation of DNR is incorrect in chart-pt is a ward of the state and documents have been sent for pt to become DNR, but awaiting approval from DSS for DNR order.             Current Medications (04/10/2015):  This is the current hospital active medication list Current Facility-Administered Medications  Medication Dose Route Frequency Provider Last Rate Last Dose  . acetaminophen (TYLENOL) tablet 650 mg  650 mg Oral Q6H PRN Richarda Overlie, MD       Or  . acetaminophen (TYLENOL) suppository 650 mg  650 mg Rectal Q6H PRN Richarda Overlie, MD      . antiseptic oral rinse (CPC / CETYLPYRIDINIUM CHLORIDE 0.05%) solution 7 mL  7 mL Mouth Rinse BID Richarda Overlie, MD   7 mL at 04/10/15 1030  . aspirin EC tablet 81 mg  81 mg Oral Daily Richarda Overlie, MD   81 mg at 04/10/15 1010  . Chlorhexidine Gluconate Cloth 2 % PADS 6 each  6 each Topical Q0600 Dorothea Ogle, MD   6 each at 04/10/15 0600  . docusate sodium (COLACE) capsule 100 mg  100 mg Oral  QHS Richarda Overlie, MD   100 mg at 04/09/15 2039  . galantamine (RAZADYNE) tablet 4 mg  4 mg Oral BID WC Otho Bellows, RPH   4 mg at 04/10/15 0827  . insulin aspart (novoLOG) injection 0-9 Units  0-9 Units Subcutaneous TID WC Richarda Overlie, MD   1 Units at 04/08/15 1600  . levalbuterol (XOPENEX) nebulizer solution 0.63 mg  0.63 mg Nebulization Q6H PRN Richarda Overlie, MD      . mupirocin ointment (BACTROBAN) 2 % 1 application  1 application Nasal BID Dorothea Ogle, MD   1 application at 04/10/15 1010  . ondansetron (ZOFRAN) tablet 4 mg  4 mg Oral Q6H PRN Richarda Overlie, MD       Or  . ondansetron (ZOFRAN) injection 4 mg  4 mg Intravenous  Q6H PRN Richarda Overlie, MD      . oxyCODONE (Oxy IR/ROXICODONE) immediate release tablet 5 mg  5 mg Oral Q4H PRN Richarda Overlie, MD      . pantoprazole (PROTONIX) EC tablet 40 mg  40 mg Oral BID Dorothea Ogle, MD   40 mg at 04/10/15 1010  . polyethylene glycol (MIRALAX / GLYCOLAX) packet 17 g  17 g Oral Daily Richarda Overlie, MD   17 g at 04/10/15 1010  . RESOURCE THICKENUP CLEAR   Oral PRN Richarda Overlie, MD      . sodium chloride flush (NS) 0.9 % injection 3 mL  3 mL Intravenous Q12H Richarda Overlie, MD   3 mL at 04/10/15 1035     Discharge Medications: Please see discharge summary for a list of discharge medications.  Relevant Imaging Results:  Relevant Lab Results:   Additional Information SSN: [id number] Pt to have palliative care services follow at SNF. Primary insurance Medicare. Other insurance plan: [employer]: [id number]  KIDD, SUZANNA A, LCSW

## 2015-04-10 NOTE — Discharge Summary (Signed)
Physician Discharge Summary  Kevin Dawson ZOX:[id number] DOB: 1927-04-28 DOA: 04/06/2015  PCP: Florentina Jenny, MD  Admit date: 04/06/2015 Discharge date: 04/10/2015  Recommendations for Outpatient Follow-up:  1. Pt will need to follow up with PCP in 1-2 weeks post discharge 2. Please obtain BMP to evaluate electrolytes and kidney function 3. Please also check CBC to evaluate Hg and Hct levels  Discharge Diagnoses:  Principal Problem:   GI bleed Active Problems:   Acute renal failure superimposed on stage 3 chronic kidney disease (HCC)   Hypoxia   Dementia   Pressure ulcer   Atrial fibrillation (HCC)   Diabetes mellitus with nephropathy (HCC)   Essential hypertension   Thrombocytopenia (HCC)   Hypernatremia   Lower GI bleed   Encounter for palliative care   Goals of care, counseling/discussion  Discharge Condition: Stable  Diet recommendation: as tolerated, aspiration precautions    Brief narrative:    82 yr old male with history of dementia, anemia, diabetes, prior history of alcohol abuse with no ongoing use of alcohol at this time (verified with the nursing home) , who was brought into the ER because he was walking down the hall at his memory care unit, all covered in blood and his underware was full of blood.   Assessment/Plan:    Rectal bleeding - hemoglobin appears to be stable - EDP discussed with Dr. Ewing Schlein from gastroenterology, given his dementia he's not a candidate for invasive workup,  - GI recommended a nuclear medicine scan if the patient continues to have recurrent bleeding with potential IR intervention - Hg ovearll stable since admission with no signs of active bleeding   Hypoxia - unable to wean off of oxygen  - pt denies dyspnea or chest pain - CT chest with small bilateral pleural effusions, bilateral dependent atelectasis, 7 mm semi-solid nodule in the inferior right upper lobe, 10 mm area of cavitation with mild, mildly irregular diffuse  wall thickening - ? chronically inflamed bulla or a small cavitary neoplasm - has been on RA this AM and with stable oxygen saturations   Thrombocytopenia - mild, no signs of bleeding reported  Hypernatremia - likely from poor oral intake - resolved   Hypokalemia - mild - supplemented and WNL this AM  Atrial fibrillation, CHADS2 score 3-4  - rate controlled, continue beta blocker, patient is not on anticoagulation given advanced age and high risk bleed   Dementia - continue dementia medications - high aspiration risk, aspiration precautions   Hypertension, essential  - continue metoprolol  DM (diabetes mellitus) (HCC) with complications of CKD - stable CBG while inpatient   Chronic kidney disease stage II - kidney function appears to be at baseline  Left hydronephrosis - discussed with urology team, no indication for intervention since kidneys atrophic and likely chronic in nature, monitor urine output upon discharge   DVT prophylaxis - SCD's  Code Status: DNR Family Communication: plan of care discussed with the patient Disposition Plan: SNF by 2/9  IV access:  Peripheral IV  Procedures and diagnostic studies:   Ct Chest Wo Contrast 04/08/2015 Small bilateral pleural effusions with mild bilateral dependent atelectasis. 2. 7 mm semi-solid nodule in the inferior right upper lobe. 3. 10 mm area of cavitation with mild, mildly irregular diffuse wall thickening. This could represent a chronically inflamed bulla or a small cavitary neoplasm. A follow-up chest CT without contrast is recommended in 6 months. 4. Marked chronic left renal hydronephrosis with progression and associated marked diffuse left renal atrophy  with progression. 5. Stable size of a large retroperitoneal fluid collection on the left with interval wall calcifications. 6. Atheromatous arterial calcifications, including the coronary arteries.   Medical Consultants:  SLP  Other Consultants:   None  IAnti-Infectives:   None  Debbora Presto, MD [facility] Pager [phone]     Discharge Exam: Filed Vitals:   04/09/15 2050 04/10/15 0613  BP: 116/54 164/61  Pulse: 71 60  Temp: 97.4 F (36.3 C) 97.8 F (36.6 C)  Resp: 20 18   Filed Vitals:   04/09/15 1430 04/09/15 2020 04/09/15 2050 04/10/15 0613  BP: 142/72  116/54 164/61  Pulse: 78 68 71 60  Temp: 98.4 F (36.9 C)  97.4 F (36.3 C) 97.8 F (36.6 C)  TempSrc: Oral  Axillary Axillary  Resp: Height:      Weight:      SpO2: 98%  100% 100%    General: Pt is alert, follows commands appropriately, not in acute distress Cardiovascular: Regular rate and rhythm, no rubs, no gallops Respiratory: Clear to auscultation bilaterally, mild rhonchi at bases  Abdominal: Soft, non tender, non distended, bowel sounds +, no guarding  Discharge Instructions  Discharge Instructions    Diet - low sodium heart healthy    Complete by:  As directed      Diet - low sodium heart healthy    Complete by:  As directed      Increase activity slowly    Complete by:  As directed      Increase activity slowly    Complete by:  As directed             Medication List    TAKE these medications        acetaminophen 325 MG tablet  Commonly known as:  TYLENOL  Take 650 mg by mouth every 6 (six) hours as needed for mild pain.     artificial tears ointment  Place 1 application into both eyes at bedtime.     aspirin EC 81 MG tablet  Take 81 mg by mouth daily.     DAILY VITE PO  Take 1 tablet by mouth daily.     docusate sodium 100 MG capsule  Commonly known as:  COLACE  Take 100 mg by mouth at bedtime.     galantamine 8 MG 24 hr capsule  Commonly known as:  RAZADYNE ER  Take 8 mg by mouth daily with breakfast.     ipratropium-albuterol 0.5-2.5 (3) MG/3ML Soln  Commonly known as:  DUONEB  Take 3 mLs by nebulization every 4 (four) hours as needed.     losartan 25 MG tablet  Commonly known as:   COZAAR  Take 25 mg by mouth daily.     metoprolol tartrate 25 MG tablet  Commonly known as:  LOPRESSOR  Take 12.5 mg by mouth daily.     polyethylene glycol packet  Commonly known as:  MIRALAX / GLYCOLAX  Take 17 g by mouth daily.     ranitidine 150 MG tablet  Commonly known as:  ZANTAC  Take 150 mg by mouth at bedtime.     THERA TEARS ALLERGY OP  Apply 1 drop to eye 4 (four) times daily.           Follow-up Information    Follow up with Florentina Jenny, MD.   Specialty:  Family Medicine   Contact information:   [phone] [place] [address] [phone]  The results of significant diagnostics from this hospitalization (including imaging, microbiology, ancillary and laboratory) are listed below for reference.     Microbiology: Recent Results (from the past 240 hour(s))  MRSA PCR Screening     Status: Abnormal   Collection Time: 04/06/15  2:03 PM  Result Value Ref Range Status   MRSA by PCR POSITIVE (A) NEGATIVE Final    Comment:        The GeneXpert MRSA Assay (FDA approved for NASAL specimens only), is one component of a comprehensive MRSA colonization surveillance program. It is not intended to diagnose MRSA infection nor to guide or monitor treatment for MRSA infections. RESULT CALLED TO, READ BACK BY AND VERIFIED WITH: KOONTZ,A AT 1600 ON [id number] BY HOOKER,B      Labs: Basic Metabolic Panel:  Recent Labs Lab 04/06/15 1039 04/06/15 1056 04/07/15 0340 04/08/15 0340 04/09/15 0429 04/10/15 0344  NA 142 144 142 146* 145 145  K 4.3 4.2 3.8 3.4* 3.7 3.5  CL 109 106 110 112* 113* 112*  CO2 27  --  26 24 24 26   GLUCOSE 154* 143* 81 93 105* 103*  BUN 27* 29* 21* 16 11 10   CREATININE 1.29* 1.30* 1.01 1.09 1.13 1.25*  CALCIUM 9.1  --  8.9 8.7* 9.0 9.0   Liver Function Tests:  Recent Labs Lab 04/06/15 1039 04/07/15 0340  AST 26 28  ALT 16* 14*  ALKPHOS 59 56  BILITOT 0.7 1.2  PROT 6.5 6.4*  ALBUMIN 3.5 3.6   No  results for input(s): LIPASE, AMYLASE in the last 168 hours. No results for input(s): AMMONIA in the last 168 hours. CBC:  Recent Labs Lab 04/06/15 1039  04/06/15 1956 04/07/15 0340 04/08/15 0340 04/09/15 0429 04/10/15 0344  WBC 5.6  < > 6.1 6.5 5.1 5.3 4.7  NEUTROABS 3.9  --   --   --   --   --   --   HGB 13.4  < > 12.1* 12.7* 11.8* 11.9* 11.9*  HCT 38.4*  < > 34.9* 36.4* 34.4* 34.5* 34.9*  MCV 101.3*  < > 100.6* 102.0* 102.4* 102.1* 102.3*  PLT 150  < > 126* 149* 138* 139* 143*  < > = values in this interval not displayed. Cardiac Enzymes:  Recent Labs Lab 04/06/15 1304 04/06/15 1956 04/07/15 0339  TROPONINI <0.03 <0.03 <0.03    CBG:  Recent Labs Lab 04/09/15 0808 04/09/15 1157 04/09/15 1659 04/09/15 2152 04/10/15 0809  GLUCAP 91 133* 106* 137* 89     SIGNED: Time coordinating discharge: 30 minutes  MAGICK-Udell Blasingame, MD  [facility] 04/10/2015, 10:03 AM Pager [phone]  If 7PM-7AM, please contact night-coverage [email or web address] Password [email or web address]

## 2015-04-10 NOTE — NC FL2 (Deleted)
[place] MEDICAID FL2 LEVEL OF CARE SCREENING TOOL     IDENTIFICATION  Patient Name: Kevin Dawson Birthdate: 12/19/1927 Sex: male Admission Date (Current Location): 04/06/2015  [facility] and [place] Number:  Producer, television/film/video and Address:  [facility],  [address], [place] [address]      Provider Number: [id number]  Attending Physician Name and Address:  Dorothea Ogle, MD  Relative Name and Phone Number:  Elon Jester Juchatz/legal guardian/[phone]    Current Level of Care: Hospital Recommended Level of Care: Skilled Nursing Facility Prior Approval Number:    Date Approved/Denied:   PASRR Number:    Discharge Plan: SNF    Current Diagnoses: Patient Active Problem List   Diagnosis Date Noted  . Lower GI bleed   . Encounter for palliative care   . Goals of care, counseling/discussion   . Acute renal failure superimposed on stage 3 chronic kidney disease (HCC) 04/08/2015  . Diabetes mellitus with nephropathy (HCC) 04/08/2015  . Essential hypertension 04/08/2015  . Atrial fibrillation (HCC) 04/08/2015  . Thrombocytopenia (HCC) 04/08/2015  . Hypernatremia 04/08/2015  . Hypoxia 04/08/2015  . GI bleed 04/06/2015  . Pressure ulcer 04/06/2015  . Dementia 02/09/2012    Orientation RESPIRATION BLADDER Height & Weight      (disoriented x 4)  Normal Incontinent Weight: 173 lb 11.6 oz (78.8 kg) Height:  6' (182.9 cm)  BEHAVIORAL SYMPTOMS/MOOD NEUROLOGICAL BOWEL NUTRITION STATUS   (n/a)  (NONE) Incontinent Diet (Dysphagia 3 (mechanical soft);Thin liquid)  AMBULATORY STATUS COMMUNICATION OF NEEDS Skin   Limited Assist Non-Verbally PU Stage and Appropriate Care (Stage I to sacrum) PU Stage 1 Dressing: Daily                     Personal Care Assistance Level of Assistance  Bathing, Feeding, Dressing Bathing Assistance: Limited assistance Feeding assistance: Independent Dressing Assistance: Limited assistance     Functional Limitations Info   Sight, Hearing, Speech Sight Info: Adequate Hearing Info: Adequate Speech Info: Impaired    SPECIAL CARE FACTORS FREQUENCY  PT (By licensed PT)     PT Frequency: 5 x a week              Contractures Contractures Info: Not present    Additional Factors Info  Code Status Code Status Info: FULL code status-documentation of DNR is incorrect in chart-pt is a ward of the state and documents have been sent for pt to become DNR, but awaiting approval from DSS for DNR order.             Current Medications (04/10/2015):  This is the current hospital active medication list Current Facility-Administered Medications  Medication Dose Route Frequency Provider Last Rate Last Dose  . acetaminophen (TYLENOL) tablet 650 mg  650 mg Oral Q6H PRN Richarda Overlie, MD       Or  . acetaminophen (TYLENOL) suppository 650 mg  650 mg Rectal Q6H PRN Richarda Overlie, MD      . antiseptic oral rinse (CPC / CETYLPYRIDINIUM CHLORIDE 0.05%) solution 7 mL  7 mL Mouth Rinse BID Richarda Overlie, MD   7 mL at 04/10/15 1030  . aspirin EC tablet 81 mg  81 mg Oral Daily Richarda Overlie, MD   81 mg at 04/10/15 1010  . Chlorhexidine Gluconate Cloth 2 % PADS 6 each  6 each Topical Q0600 Dorothea Ogle, MD   6 each at 04/10/15 0600  . docusate sodium (COLACE) capsule 100 mg  100 mg  Oral QHS Richarda Overlie, MD   100 mg at 04/09/15 2039  . galantamine (RAZADYNE) tablet 4 mg  4 mg Oral BID WC Otho Bellows, RPH   4 mg at 04/10/15 0827  . insulin aspart (novoLOG) injection 0-9 Units  0-9 Units Subcutaneous TID WC Richarda Overlie, MD   1 Units at 04/08/15 1600  . levalbuterol (XOPENEX) nebulizer solution 0.63 mg  0.63 mg Nebulization Q6H PRN Richarda Overlie, MD      . mupirocin ointment (BACTROBAN) 2 % 1 application  1 application Nasal BID Dorothea Ogle, MD   1 application at 04/10/15 1010  . ondansetron (ZOFRAN) tablet 4 mg  4 mg Oral Q6H PRN Richarda Overlie, MD       Or  . ondansetron (ZOFRAN) injection 4 mg  4 mg Intravenous Q6H PRN Richarda Overlie, MD      . oxyCODONE (Oxy IR/ROXICODONE) immediate release tablet 5 mg  5 mg Oral Q4H PRN Richarda Overlie, MD      . pantoprazole (PROTONIX) EC tablet 40 mg  40 mg Oral BID Dorothea Ogle, MD   40 mg at 04/10/15 1010  . polyethylene glycol (MIRALAX / GLYCOLAX) packet 17 g  17 g Oral Daily Richarda Overlie, MD   17 g at 04/10/15 1010  . RESOURCE THICKENUP CLEAR   Oral PRN Richarda Overlie, MD      . sodium chloride flush (NS) 0.9 % injection 3 mL  3 mL Intravenous Q12H Richarda Overlie, MD   3 mL at 04/10/15 1035     Discharge Medications: Please see discharge summary for a list of discharge medications.  Relevant Imaging Results:  Relevant Lab Results:   Additional Information SSN: [id number] Pt to have palliative care services follow at SNF. Primary insurance Medicare. Other insurance plan: [employer]: [id number]  KIDD, SUZANNA A, LCSW

## 2015-04-10 NOTE — Clinical Social Work Placement (Signed)
   CLINICAL SOCIAL WORK PLACEMENT  NOTE  Date:  04/10/2015  Patient Details  Name: Kevin Dawson MRN: [id number] Date of Birth: [birth year]  Clinical Social Work is seeking post-discharge placement for this patient at the Skilled  Nursing Facility level of care (*CSW will initial, date and re-position this form in  chart as items are completed):  Yes   Patient/family provided with [facility] Clinical Social Work Department's list of facilities offering this level of care within the geographic area requested by the patient (or if unable, by the patient's family).  Yes   Patient/family informed of their freedom to choose among providers that offer the needed level of care, that participate in Medicare, Medicaid or managed care program needed by the patient, have an available bed and are willing to accept the patient.  Yes   Patient/family informed of [facility]'s ownership interest in [facility] and [facility], as well as of the fact that they are under no obligation to receive care at these facilities.  PASRR submitted to EDS on 04/10/15     PASRR number received on 04/10/15     Existing PASRR number confirmed on       FL2 transmitted to all facilities in geographic area requested by pt/family on 04/10/15     FL2 transmitted to all facilities within larger geographic area on       Patient informed that his/her managed care company has contracts with or will negotiate with certain facilities, including the following:            Patient/family informed of bed offers received.  Patient chooses bed at       Physician recommends and patient chooses bed at      Patient to be transferred to   on  .  Patient to be transferred to facility by       Patient family notified on   of transfer.  Name of family member notified:        PHYSICIAN Please sign FL2     Additional Comment:    _______________________________________________ Orson Eva, LCSW 04/10/2015, 4:50  PM

## 2015-04-10 NOTE — Evaluation (Signed)
Physical Therapy Evaluation Patient Details Name: Kevin Dawson MRN: [id number] DOB: 1928-01-23 Today's Date: 04/10/2015   History of Present Illness  82 yo male from ALF/memory care admitted 04/06/15 with rectal bleeding  Clinical Impression  Patient was somewhat agitated initially when PT was assisting to sitting. RN notified and was able to redirect patient in Getting up to ambulate.. Patient cooperated and was assisted to sitting  On the edge of bed and  ambulated  in the room with min assist and RW. Currently , recommend that patient have  Assistance for any upright mobility. Patient will benefit from PT to address problems listed in the note below to return to ALF.  Follow Up Recommendations Home health PT;Supervision/Assistance - 24 hour (ALF)    Equipment Recommendations  None recommended by PT    Recommendations for Other Services       Precautions / Restrictions Precautions Precautions: Fall Precaution Comments: can be agitated      Mobility  Bed Mobility   Bed Mobility: Supine to Sit     Supine to sit: Min assist     General bed mobility comments: first  effort to gude patient to sit up resulted in patient mumbling loudly and balled fist and struck out at therapist and beat his fist on his chest. RN called to the room, stated he had not shown this behavior. Earlier, had massive BM with extensive clean up by student nurses. patient calmed and was able to get to the edge of the bed with no further outburst. with RN assistance with verbal commands.  Transfers Overall transfer level: Needs assistance Equipment used: Rolling walker (2 wheeled) Transfers: Sit to/from Stand Sit to Stand: Min assist         General transfer comment: extra time to rise, steady assist to stand and for several steps going forard.   Ambulation/Gait Ambulation/Gait assistance: Min assist Ambulation Distance (Feet): 20 Feet Assistive device: Rolling walker (2 wheeled) Gait  Pattern/deviations: Step-through pattern;Decreased stride length;Shuffle     General Gait Details: steady assist for balance, multimodal cues for directing to recliner.  Stairs            Wheelchair Mobility    Modified Rankin (Stroke Patients Only)       Balance Overall balance assessment: Needs assistance;History of Falls Sitting-balance support: No upper extremity supported;Feet supported Sitting balance-Leahy Scale: Fair     Standing balance support: During functional activity;Bilateral upper extremity supported Standing balance-Leahy Scale: Poor                               Pertinent Vitals/Pain Pain Assessment: Faces Faces Pain Scale: No hurt    Home Living Family/patient expects to be discharged to:: Assisted living                 Additional Comments: walked with RW PTA    Prior Function Level of Independence: Needs assistance               Hand Dominance        Extremity/Trunk Assessment   Upper Extremity Assessment: Generalized weakness           Lower Extremity Assessment: Generalized weakness      Cervical / Trunk Assessment: Other exceptions  Communication      Cognition Arousal/Alertness: Awake/alert Behavior During Therapy: Agitated (balled up fist and swung at the therapist) Overall Cognitive Status: History of cognitive impairments - at baseline  Memory: Decreased recall of precautions;Decreased short-term memory              General Comments      Exercises        Assessment/Plan    PT Assessment Patient needs continued PT services  PT Diagnosis Difficulty walking;Generalized weakness;Altered mental status   PT Problem List Decreased strength;Decreased activity tolerance;Decreased balance;Decreased mobility;Decreased cognition;Decreased safety awareness  PT Treatment Interventions DME instruction;Gait training;Functional mobility training;Therapeutic activities   PT Goals (Current  goals can be found in the Care Plan section) Acute Rehab PT Goals Patient Stated Goal: unable PT Goal Formulation: Patient unable to participate in goal setting Time For Goal Achievement: 04/24/15 Potential to Achieve Goals: Fair    Frequency Min 2X/week   Barriers to discharge        Co-evaluation               End of Session Equipment Utilized During Treatment: Gait belt Activity Tolerance: Patient tolerated treatment well Patient left: in chair;with call bell/phone within reach;with chair alarm set Nurse Communication: Mobility status         Time: 1610-9604 PT Time Calculation (min) (ACUTE ONLY): 24 min   Charges:   PT Evaluation $PT Eval Low Complexity: 1 Procedure PT Treatments $Gait Training: 8-22 mins   PT G Codes:        Rada Hay 04/10/2015, 12:41 PM [facility] Kelch PT [phone]

## 2015-04-10 NOTE — Progress Notes (Signed)
PHARMACIST - PHYSICIAN COMMUNICATION  Key Points: Use following P&T approved IV to PO antibiotic change policy.  Description contains the criteria that are approved Note: Policy Excludes:  Esophagectomy patients DR:   Izola Price CONCERNING: IV to Oral Route Change Policy  RECOMMENDATION: This patient is receiving pantoprazole by the intravenous route.  Based on criteria approved by the Pharmacy and Therapeutics Committee, the intravenous medication(s) is/are being converted to the equivalent oral dose form(s).   DESCRIPTION: These criteria include:  The patient is eating (either orally or via tube) and/or has been taking other orally administered medications for a least 24 hours  The patient has no evidence of active gastrointestinal bleeding or impaired GI absorption (gastrectomy, short bowel, patient on TNA or NPO).  If you have questions about this conversion, please contact the Pharmacy Department    [phone] )  [facility]   [phone] )  [facility]   [phone] )  [facility]   [phone] )  [facility]   [phone] )  [facility]    Adalberto Cole, PharmD, [place] Pager [phone] 04/10/2015 9:45 AM

## 2015-04-10 NOTE — Progress Notes (Signed)
Speech Language Pathology Treatment: Dysphagia  Patient Details Name: Kevin Dawson MRN: [id number] DOB: 1928/01/05 Today's Date: 04/10/2015 Time: 7473-4037 SLP Time Calculation (min) (ACUTE ONLY): 10 min  Assessment / Plan / Recommendation Clinical Impression  Pt seen with po intake to determine readiness for dietary advancement and for education.  Per nursing students, pt spent much of the morning eating breakfast.  SLP prefers pt have set up assistance with only intermittent supervision to allow him to control rate of intake for maximal airway protection.  Observed pt consuming cheese "stick" = poor mastication abilities due to edentulous state with pt being unable to orally clear without jello or water.  Pt is not appropriate for solids due to his oral dysphagia.  Pharyngeal swallow appears intact/adequate.  Do not anticipate aspiration of intake occuring when pt fully alert and self feeding.  Recommend continue soft/thin diet with intermittent supervision.     HPI HPI: 82 yr old male with history of dementia, anemia, diabetes, prior history of alcohol abuse with no ongoing use of alcohol at this time (verified with the nursing home) admitted with rectal bleeding, hypoxia, hypernatremia,hypokalemia.  Lungs are CTA, rhonchi at bases. Followed by SLP during 3/15 admission and dx with mild oropharyngeal dysphagia with no overt aspiration at the time.Thin liquids and soft diet were recommended after MBS 05/15/13.  CXR 2/7 with small bilateral pl effusions; 7 mm semi solid nodule inf RUL; area of cavitation with mild wall thickening.        SLP Plan  All goals met     Recommendations  Diet recommendations: Dysphagia 3 (mechanical soft);Thin liquid Liquids provided via: Cup;Straw Medication Administration:  (as tolerated) Supervision: Patient able to self feed;Intermittent supervision to cue for compensatory strategies Compensations: Slow rate;Small sips/bites (check for oral residuals when  finished with meal) Postural Changes and/or Swallow Maneuvers: Seated upright 90 degrees;Upright 30-60 min after meal             Follow up Recommendations: None Plan: All goals met     [place], [place] [facility] SLP [phone]

## 2015-04-10 NOTE — Clinical Social Work Note (Signed)
Clinical Social Work Assessment  Patient Details  Name: Kevin Dawson MRN: [id number] Date of Birth: [birth year]  Date of referral:  04/10/15               Reason for consult:  Facility Placement, Discharge Planning                Permission sought to share information with:    Permission granted to share information::     Name::     Kevin Dawson  Agency::     Relationship::  legal guardian  Contact Information:  [phone]  Housing/Transportation Living arrangements for the past 2 months:  [place] of Information:  Guardian Patient Interpreter Needed:  None Criminal Activity/Legal Involvement Pertinent to Current Situation/Hospitalization:  No - Comment as needed Significant Relationships:  None Lives with:  Facility Resident Do you feel safe going back to the place where you live?    Need for family participation in patient care:  Yes (Comment) (pt has a legal guardian)  Care giving concerns:  Pt from [place] and facility cannot accept pt back. Exploring SNF options.    Social Worker assessment / plan:   Pt admitted from [facility] memory care unit. PT evaluated pt this morning and recommending that pt have 1:1 assist when ambulating at ALF. [facility] ALF memory care unit does not feel that they can meet this time.   Patient/Family's Understanding of and Emotional Response to Diagnosis, Current Treatment, and Prognosis:    CSW received notification from PT that pt needing 1:1 while ambulating. CSW discussed with [facility] AL[facility] which pt admitted from memory care at facility. ALF does not feel that they can meet this need and pt needs higher level of care.   Pt decisions are made by legal guardian, Kevin Dawson. Pt guardian agreeable to exploring SNF. Pt guardian stated that pt is a FULL code at this time as DSS has not given DNR determination as paperwork has to be completed in regard to this and DSS guardian will fax DNR  recommendation paperwork to this CSW for attending and non-attending MDs to complete.   CSW discussed with pt guardian that [facility] unable to accept pt back and guardian is agreeable to SNF search.   CSW received DNR recommendation paperwork and met with Dr. Doyle Askew and Dr. Rowe Pavy for completion of paperwork. Paperwork notarized and faxed back to DSS guardian for DSS to review and make determination for DNR status.   CSW completed FL2 and initiated SNF search to [facility]   CSW to follow up with pt guardian re: SNF bed offers.   CSW to facilitate SNF for discharge tomorrow.   Employment status:  Retired Health visitor PT Recommendations:  Home with [place], [place], [place] / Referral to community resources:  [place]  Patient/Family's Response to care:  Pt guardian agreeable for CSW to assist with SNF placement.   Emotional Assessment Appearance:  Appears stated age Attitude/Demeanor/Rapport:  Unable to Assess Affect (typically observed):  Unable to Assess Orientation:   (disoriented x 4) Alcohol / Substance use:    Psych involvement (Current and /or in the community):  No (Comment)  Discharge Needs  Concerns to be addressed:  Discharge Planning Concerns Readmission within the last 30 days:  No Current discharge risk:  Physical Impairment, Chronically ill Barriers to Discharge:   (memory care ALF will not accept pt back; have to work  with patient guardian re: SNF)   Ladell Pier, LCSW 04/10/2015, 4:37 PM

## 2015-04-11 LAB — CBC
HCT: 34.7 % — ABNORMAL LOW (ref 39.0–52.0)
HEMOGLOBIN: 12 g/dL — AB (ref 13.0–17.0)
MCH: 35.1 pg — AB (ref 26.0–34.0)
MCHC: 34.6 g/dL (ref 30.0–36.0)
MCV: 101.5 fL — ABNORMAL HIGH (ref 78.0–100.0)
PLATELETS: 148 10*3/uL — AB (ref 150–400)
RBC: 3.42 MIL/uL — ABNORMAL LOW (ref 4.22–5.81)
RDW: 13.6 % (ref 11.5–15.5)
WBC: 5.3 10*3/uL (ref 4.0–10.5)

## 2015-04-11 LAB — BASIC METABOLIC PANEL
Anion gap: 7 (ref 5–15)
BUN: 9 mg/dL (ref 6–20)
CALCIUM: 9.1 mg/dL (ref 8.9–10.3)
CHLORIDE: 113 mmol/L — AB (ref 101–111)
CO2: 24 mmol/L (ref 22–32)
CREATININE: 1.1 mg/dL (ref 0.61–1.24)
GFR calc Af Amer: >60 mL/min (ref >60)
GFR calc non Af Amer: 59 mL/min — ABNORMAL LOW (ref >60)
Glucose, Bld: 110 mg/dL — ABNORMAL HIGH (ref 65–99)
Potassium: 3.5 mmol/L (ref 3.5–5.1)
SODIUM: 144 mmol/L (ref 135–145)

## 2015-04-11 LAB — GLUCOSE, CAPILLARY
GLUCOSE-CAPILLARY: 89 mg/dL (ref 65–99)
Glucose-Capillary: 190 mg/dL — ABNORMAL HIGH (ref 65–99)

## 2015-04-11 NOTE — Plan of Care (Signed)
Problem: Safety: Goal: Ability to remain free from injury will improve Outcome: Progressing Bed alarm on   

## 2015-04-11 NOTE — Progress Notes (Addendum)
CSW continuing to follow.  CSW spoke with pt legal guardian, Earlie Raveling regarding SNF bed offers this evening. Pt legal guardian chooses [facility] as pt has been to facility in the past. Pt legal guardian will assist pt will applying for long term care Medicaid in order to stay at SNF long term and get hospice services in place once Medicaid approved. Pt can go to [facility] under Medicare with palliative to follow at this time.  Legal Guardian will update CSW on determination of pt being DNR. Doctor recommendation documents were sent yesterday to DSS and director has to review.  CSW to facilitate pt discharge needs to [facility] tomorrow.  Loletta Specter, MSW, LCSW Clinical Social Work [phone]

## 2015-04-11 NOTE — Progress Notes (Signed)
Pt for discharge to [facility].  CSW facilitated pt discharge needs including contacting pt discharge information via epic hub to [facility], discussed with pt guardian, Earlie Raveling, providing RN phone number to call report, and arranging ambulance transport for pt to [facility].   No further social work needs identified at this time.  CSW signing off.   Loletta Specter, MSW, LCSW Clinical Social Work [phone]

## 2015-04-11 NOTE — Progress Notes (Signed)
Patient had lost IV access on day shift. Not restarted. Paged NP to see if could leave out. Order obtained to keep out til am.

## 2015-04-11 NOTE — Discharge Summary (Signed)
Physician Discharge Summary  Kevin Dawson ZOX:[id number] DOB: [birth year] DOA: 04/06/2015  PCP: Florentina Jenny, MD  Admit date: 04/06/2015 Discharge date: 04/11/2015  Recommendations for Outpatient Follow-up:  1. Pt will need to follow up with PCP in 1-2 weeks post discharge 2. Please obtain BMP to evaluate electrolytes and kidney function 3. Please also check CBC to evaluate Hg and Hct levels  Discharge Diagnoses:  Principal Problem:   GI bleed Active Problems:   Acute renal failure superimposed on stage 3 chronic kidney disease (HCC)   Hypoxia   Dementia   Pressure ulcer   Atrial fibrillation (HCC)   Diabetes mellitus with nephropathy (HCC)   Essential hypertension   Thrombocytopenia (HCC)   Hypernatremia   Lower GI bleed   Encounter for palliative care   Goals of care, counseling/discussion  Discharge Condition: Stable  Diet recommendation: as tolerated, aspiration precautions    Brief narrative:    82 yr old male with history of dementia, anemia, diabetes, prior history of alcohol abuse with no ongoing use of alcohol at this time (verified with the nursing home) , who was brought into the ER because he was walking down the hall at his memory care unit, all covered in blood and his underware was full of blood.   Assessment/Plan:    Rectal bleeding - hemoglobin appears to be stable - EDP discussed with Dr. Ewing Schlein from gastroenterology, given his dementia he's not a candidate for invasive workup,  - GI recommended a nuclear medicine scan if the patient continues to have recurrent bleeding with potential IR intervention - Hg ovearll stable since admission with no signs of active bleeding   Hypoxia - pt denies dyspnea or chest pain - CT chest with small bilateral pleural effusions, bilateral dependent atelectasis, 7 mm semi-solid nodule in the inferior right upper lobe, 10 mm area of cavitation with mild, mildly irregular diffuse wall thickening - ? chronically  inflamed bulla or a small cavitary neoplasm - has been on RA this AM and with stable oxygen saturations   Thrombocytopenia - mild, no signs of bleeding reported  Hypernatremia - likely from poor oral intake - resolved   Hypokalemia - resolved   Atrial fibrillation, CHADS2 score 3-4  - rate controlled, continue beta blocker, patient is not on anticoagulation given advanced age and high risk bleed   Dementia - continue dementia medications - high aspiration risk, aspiration precautions   Hypertension, essential  - continue metoprolol  DM (diabetes mellitus) (HCC) with complications of CKD - stable CBG while inpatient  - no need for antihyperglycemic regimen  - diet controlled   Chronic kidney disease stage II - kidney function appears to be at baseline, Cr is WNL  Left hydronephrosis - discussed with urology team, no indication for intervention since kidneys atrophic and likely chronic in nature, monitor urine output upon discharge    Code Status: Full Code Family Communication: plan of care discussed with the patient Disposition Plan: SNF when bed available   IV access:  Peripheral IV  Procedures and diagnostic studies:   Ct Chest Wo Contrast 04/08/2015 Small bilateral pleural effusions with mild bilateral dependent atelectasis. 2. 7 mm semi-solid nodule in the inferior right upper lobe. 3. 10 mm area of cavitation with mild, mildly irregular diffuse wall thickening. This could represent a chronically inflamed bulla or a small cavitary neoplasm. A follow-up chest CT without contrast is recommended in 6 months. 4. Marked chronic left renal hydronephrosis with progression and associated marked diffuse left renal atrophy  with progression. 5. Stable size of a large retroperitoneal fluid collection on the left with interval wall calcifications. 6. Atheromatous arterial calcifications, including the coronary arteries.   Medical Consultants:  SLP  Other  Consultants:  None  IAnti-Infectives:   None  Debbora Presto, MD [facility] Pager [phone]     Discharge Exam: Filed Vitals:   04/10/15 2120 04/11/15 0700  BP: 166/75 158/74  Pulse: 76 105  Temp: 98.3 F (36.8 C) 98.2 F (36.8 C)  Resp: 16 20   Filed Vitals:   04/10/15 2058 04/10/15 2100 04/10/15 2120 04/11/15 0700  BP:   166/75 158/74  Pulse:  68 76 105  Temp:   98.3 F (36.8 C) 98.2 F (36.8 C)  TempSrc:   Oral Oral  Resp:   16 20  Height:      Weight:      SpO2: 98%  100% 94%    General: Pt is alert, follows commands appropriately, not in acute distress Cardiovascular: Regular rate and rhythm, no rubs, no gallops Respiratory: Clear to auscultation bilaterally, mild rhonchi at bases  Abdominal: Soft, non tender, non distended, bowel sounds +, no guarding  Discharge Instructions      Discharge Instructions    Diet - low sodium heart healthy    Complete by:  As directed      Diet - low sodium heart healthy    Complete by:  As directed      Diet - low sodium heart healthy    Complete by:  As directed      Increase activity slowly    Complete by:  As directed      Increase activity slowly    Complete by:  As directed      Increase activity slowly    Complete by:  As directed             Medication List    TAKE these medications        acetaminophen 325 MG tablet  Commonly known as:  TYLENOL  Take 650 mg by mouth every 6 (six) hours as needed for mild pain.     artificial tears ointment  Place 1 application into both eyes at bedtime.     aspirin EC 81 MG tablet  Take 81 mg by mouth daily.     DAILY VITE PO  Take 1 tablet by mouth daily.     docusate sodium 100 MG capsule  Commonly known as:  COLACE  Take 100 mg by mouth at bedtime.     galantamine 8 MG 24 hr capsule  Commonly known as:  RAZADYNE ER  Take 8 mg by mouth daily with breakfast.     ipratropium-albuterol 0.5-2.5 (3) MG/3ML Soln  Commonly known as:  DUONEB   Take 3 mLs by nebulization every 4 (four) hours as needed.     losartan 25 MG tablet  Commonly known as:  COZAAR  Take 25 mg by mouth daily.     metoprolol tartrate 25 MG tablet  Commonly known as:  LOPRESSOR  Take 12.5 mg by mouth daily.     polyethylene glycol packet  Commonly known as:  MIRALAX / GLYCOLAX  Take 17 g by mouth daily.     ranitidine 150 MG tablet  Commonly known as:  ZANTAC  Take 150 mg by mouth at bedtime.     THERA TEARS ALLERGY OP  Apply 1 drop to eye 4 (four) times daily.       Follow-up Information  Follow up with Florentina Jenny, MD.   Specialty:  Family Medicine   Contact information:   [phone] [place] [address] [phone]       Follow up with [facility] .   Specialty:  Skilled Nursing Facility   Contact information:   [address] [place] [address] [phone]       The results of significant diagnostics from this hospitalization (including imaging, microbiology, ancillary and laboratory) are listed below for reference.     Microbiology: Recent Results (from the past 240 hour(s))  MRSA PCR Screening     Status: Abnormal   Collection Time: 04/06/15  2:03 PM  Result Value Ref Range Status   MRSA by PCR POSITIVE (A) NEGATIVE Final    Comment:        The GeneXpert MRSA Assay (FDA approved for NASAL specimens only), is one component of a comprehensive MRSA colonization surveillance program. It is not intended to diagnose MRSA infection nor to guide or monitor treatment for MRSA infections. RESULT CALLED TO, READ BACK BY AND VERIFIED WITH: KOONTZ,A AT 1600 ON [id number] BY HOOKER,B      Labs: Basic Metabolic Panel:  Recent Labs Lab 04/07/15 0340 04/08/15 0340 04/09/15 0429 04/10/15 0344 04/11/15 0343  NA 142 146* 145 145 144  K 3.8 3.4* 3.7 3.5 3.5  CL 110 112* 113* 112* 113*  CO2 GLUCOSE 81 93 105* 103* 110*  BUN 21* CREATININE 1.01 1.09 1.13 1.25* 1.10  CALCIUM 8.9 8.7* 9.0 9.0 9.1   Liver Function Tests:  Recent Labs Lab 04/06/15 1039 04/07/15 0340  AST 26 28  ALT 16* 14*  ALKPHOS 59 56  BILITOT 0.7 1.2  PROT 6.5 6.4*  ALBUMIN 3.5 3.6   No results for input(s): LIPASE, AMYLASE in the last 168 hours. No results for input(s): AMMONIA in the last 168 hours. CBC:  Recent Labs Lab 04/06/15 1039  04/07/15 0340 04/08/15 0340 04/09/15 0429 04/10/15 0344 04/11/15 0343  WBC 5.6  < > 6.5 5.1 5.3 4.7 5.3  NEUTROABS 3.9  --   --   --   --   --   --   HGB 13.4  < > 12.7* 11.8* 11.9* 11.9* 12.0*  HCT 38.4*  < > 36.4* 34.4* 34.5* 34.9* 34.7*  MCV 101.3*  < > 102.0* 102.4* 102.1* 102.3* 101.5*  PLT 150  < > 149* 138* 139* 143* 148*  < > = values in this interval not displayed. Cardiac Enzymes:  Recent Labs Lab 04/06/15 1304 04/06/15 1956 04/07/15 0339  TROPONINI <0.03 <0.03 <0.03    CBG:  Recent Labs Lab 04/10/15 0809 04/10/15 1202 04/10/15 1716 04/10/15 2123 04/11/15 0749  GLUCAP 89 106* 105* 115* 89     SIGNED: Time coordinating discharge: 30 minutes  Kevin Varano, MD  [facility] 04/11/2015, 10:35 AM Pager [phone]  If 7PM-7AM, please contact night-coverage [email or web address] Password [email or web address]

## 2015-04-14 ENCOUNTER — Non-Acute Institutional Stay (SKILLED_NURSING_FACILITY): Payer: Medicare Other | Admitting: Internal Medicine

## 2015-04-14 ENCOUNTER — Encounter: Payer: Self-pay | Admitting: Internal Medicine

## 2015-04-14 DIAGNOSIS — D696 Thrombocytopenia, unspecified: Secondary | ICD-10-CM

## 2015-04-14 DIAGNOSIS — R0902 Hypoxemia: Secondary | ICD-10-CM

## 2015-04-14 DIAGNOSIS — F0390 Unspecified dementia without behavioral disturbance: Secondary | ICD-10-CM | POA: Diagnosis not present

## 2015-04-14 DIAGNOSIS — E870 Hyperosmolality and hypernatremia: Secondary | ICD-10-CM | POA: Diagnosis not present

## 2015-04-14 DIAGNOSIS — N183 Chronic kidney disease, stage 3 unspecified: Secondary | ICD-10-CM

## 2015-04-14 DIAGNOSIS — E1121 Type 2 diabetes mellitus with diabetic nephropathy: Secondary | ICD-10-CM

## 2015-04-14 DIAGNOSIS — I480 Paroxysmal atrial fibrillation: Secondary | ICD-10-CM | POA: Diagnosis not present

## 2015-04-14 DIAGNOSIS — I10 Essential (primary) hypertension: Secondary | ICD-10-CM | POA: Diagnosis not present

## 2015-04-14 DIAGNOSIS — N179 Acute kidney failure, unspecified: Secondary | ICD-10-CM

## 2015-04-14 DIAGNOSIS — N1330 Unspecified hydronephrosis: Secondary | ICD-10-CM | POA: Diagnosis not present

## 2015-04-14 DIAGNOSIS — K922 Gastrointestinal hemorrhage, unspecified: Secondary | ICD-10-CM

## 2015-04-14 NOTE — Assessment & Plan Note (Signed)
hemoglobin appears to be stable - EDP discussed with Dr. Ewing Schlein from gastroenterology, given his dementia he's not a candidate for invasive workup,  - GI recommended a nuclear medicine scan if the patient continues to have recurrent bleeding with potential IR intervention - Hg ovearll stable since admission with no signs of active bleeding

## 2015-04-14 NOTE — Progress Notes (Signed)
MRN: [id number] Name: Kevin Dawson  Sex: male Age: 82 y.o. DOB: 09-27-1927  PSC #: Oretha Ellis Facility/Room: Level Of Care: SNF Provider: Merrilee Seashore D Emergency Contacts: Extended Emergency Contact Information Primary Emergency Contact: Cecelia Byars [place] Home Phone: [phone] Relation: Legal Guardian Secondary Emergency Contact: Carvel Getting [place] Home Phone: [phone] Relation: Other  Code Status:   Allergies: Review of patient's allergies indicates no known allergies.  Chief Complaint  Patient presents with  . New Admit To SNF    HPI: Patient is 82 y.o. male with dementia, anemia, diabetes, prior history of alcohol abuse with no ongoing use of alcohol at this time (verified with the nursing home) , who was brought into the ER because he was walking down the hall at his memory care unit, all covered in blood and his underware was full of blood. Pt was admitted to [facility] from 2/5-10 where his Hb remained stable and pt was deemed not a candidate for invasive work-up.Hospital  Course was complicated by hypernatremia, hypokalemia and unexplained hypoxia, that resolved. Pt is admitted to SNF  For generalized weakness. While at SNF pt will be followed for AF, tx with metoprolol and ASA, HTN, tx with losartan and metoprolol and dementia, tx witr razadyne.  Past Medical History  Diagnosis Date  . Dementia   . Anemia   . Diabetes mellitus without complication (HCC)   . Alcoholic psychosis (HCC)   . Alcohol abuse   . Subtrochanteric fracture [facility])     Past Surgical History  Procedure Laterality Date  . Cholecystectomy    . Im nailing of left femur fracture    . Cystoscopy        Medication List       This list is accurate as of: 04/14/15 11:59 PM.  Always use your most recent med list.               acetaminophen 325 MG tablet  Commonly known as:  TYLENOL  Take 650 mg by mouth every 6 (six) hours as needed  for mild pain.     artificial tears ointment  Place 1 application into both eyes at bedtime.     aspirin EC 81 MG tablet  Take 81 mg by mouth daily.     DAILY VITE PO  Take 1 tablet by mouth daily.     docusate sodium 100 MG capsule  Commonly known as:  COLACE  Take 100 mg by mouth at bedtime.     galantamine 8 MG 24 hr capsule  Commonly known as:  RAZADYNE ER  Take 8 mg by mouth daily with breakfast.     ipratropium-albuterol 0.5-2.5 (3) MG/3ML Soln  Commonly known as:  DUONEB  Take 3 mLs by nebulization every 4 (four) hours as needed.     losartan 25 MG tablet  Commonly known as:  COZAAR  Take 25 mg by mouth daily.     metoprolol tartrate 25 MG tablet  Commonly known as:  LOPRESSOR  Take 12.5 mg by mouth daily.     polyethylene glycol packet  Commonly known as:  MIRALAX / GLYCOLAX  Take 17 g by mouth daily.     ranitidine 150 MG tablet  Commonly known as:  ZANTAC  Take 150 mg by mouth at bedtime.     THERA TEARS ALLERGY OP  Apply 1 drop to eye 4 (four) times daily.        No orders of the defined types  were placed in this encounter.     There is no immunization history on file for this patient.  Social History  Substance Use Topics  . Smoking status: Unknown If Ever Smoked  . Smokeless tobacco: Not on file  . Alcohol Use: No    Family history is +DM, HTN   Review of Systems UTO 2/2 dementia     Filed Vitals:   04/20/15 1620  BP: 158/79  Pulse: 97  Temp: 97.9 F (36.6 C)  Resp: 20    SpO2 Readings from Last 1 Encounters:  04/11/15 94%        Physical Exam  GENERAL APPEARANCE: Alert, conversant,  No acute distress.  SKIN: No diaphoresis rash HEAD: Normocephalic, atraumatic  EYES: Conjunctiva/lids clear. Pupils round, reactive. EOMs intact.  EARS: External exam WNL, canals clear. Hearing grossly normal.  NOSE: No deformity or discharge.  MOUTH/THROAT: Lips w/o lesions  RESPIRATORY: Breathing is even, unlabored. Lung sounds are  clear   CARDIOVASCULAR: Heart RRR no murmurs, rubs or gallops. No peripheral edema.   GASTROINTESTINAL: Abdomen is soft, non-tender, not distended w/ normal bowel sounds. GENITOURINARY: Bladder non tender, not distended  MUSCULOSKELETAL: No abnormal joints or musculature NEUROLOGIC:  Cranial nerves 2-12 grossly intact. Moves all extremities  PSYCHIATRIC: dementia, no behavioral issues  Patient Active Problem List   Diagnosis Date Noted  . Hydronephrosis of left kidney 04/20/2015  . Lower GI bleed   . Encounter for palliative care   . Goals of care, counseling/discussion   . Acute renal failure superimposed on stage 3 chronic kidney disease (HCC) 04/08/2015  . Diabetes mellitus with nephropathy (HCC) 04/08/2015  . Essential hypertension 04/08/2015  . Atrial fibrillation (HCC) 04/08/2015  . Thrombocytopenia (HCC) 04/08/2015  . Hypernatremia 04/08/2015  . Hypoxia 04/08/2015  . GI bleed 04/06/2015  . Pressure ulcer 04/06/2015  . Dementia 02/09/2012    CBC    Component Value Date/Time   WBC 5.3 04/11/2015 0343   RBC 3.42* 04/11/2015 0343   HGB 12.0* 04/11/2015 0343   HCT 34.7* 04/11/2015 0343   PLT 148* 04/11/2015 0343   MCV 101.5* 04/11/2015 0343   LYMPHSABS 1.1 04/06/2015 1039   MONOABS 0.5 04/06/2015 1039   EOSABS 0.2 04/06/2015 1039   BASOSABS 0.0 04/06/2015 1039    CMP     Component Value Date/Time   NA 144 04/11/2015 0343   K 3.5 04/11/2015 0343   CL 113* 04/11/2015 0343   CO2 24 04/11/2015 0343   GLUCOSE 110* 04/11/2015 0343   BUN 9 04/11/2015 0343   CREATININE 1.10 04/11/2015 0343   CALCIUM 9.1 04/11/2015 0343   PROT 6.4* 04/07/2015 0340   ALBUMIN 3.6 04/07/2015 0340   AST 28 04/07/2015 0340   ALT 14* 04/07/2015 0340   ALKPHOS 56 04/07/2015 0340   BILITOT 1.2 04/07/2015 0340   GFRNONAA 59* 04/11/2015 0343   GFRAA >60 04/11/2015 0343    Lab Results  Component Value Date   HGBA1C 5.7* 04/06/2015     No results found.  Not all labs, radiology exams  or other studies done during hospitalization come through on my EPIC note; however they are reviewed by me.    Assessment and Plan  Lower GI bleed hemoglobin appears to be stable - EDP discussed with Dr. Ewing Schlein from gastroenterology, given his dementia he's not a candidate for invasive workup,  - GI recommended a nuclear medicine scan if the patient continues to have recurrent bleeding with potential IR intervention - Hg ovearll stable since admission  with no signs of active bleeding    Hypoxia pt denies dyspnea or chest pain - CT chest with small bilateral pleural effusions, bilateral dependent atelectasis, 7 mm semi-solid nodule in the inferior right upper lobe, 10 mm area of cavitation with mild, mildly irregular diffuse wall thickening - ? chronically inflamed bulla or a small cavitary neoplasm - has been on RA this AM and with stable oxygen saturations    Thrombocytopenia (HCC) Mild, resolved; SNF - will follow with CBC  Hypernatremia Resolved; SNF - will follow with BMP  Atrial fibrillation (HCC) SNF - rate controlled, continue beta blocker, patient is not on anticoagulation given advanced age and high risk bleed   Essential hypertension SNF - acceptable on lopressor 12.5 mg daily and cozaar 25 mg daily; monitor and make changes as needed  Diabetes mellitus with nephropathy (HCC) stable CBG while inpatient  - no need for antihyperglycemic regimen  SNF - diet controlled , pt on ARB  Dementia SNF - not stated as uncontrolled ; cont razadyne 8 mg daily  Acute renal failure superimposed on stage 3 chronic kidney disease (HCC) At d/c kidney function appears to be at baseline, Cr is WNL; SNF - will follow up BMP  Hydronephrosis of left kidney discussed with urology team, no indication for intervention since kidneys atrophic and likely chronic in nature, monitor urine output upon discharge    Time spent > 45 min;> 50% of time with patient was spent reviewing records,  labs, tests and studies, counseling and developing plan of care  Margit Hanks, MD

## 2015-04-16 LAB — BASIC METABOLIC PANEL
BUN: 15 mg/dL (ref 4–21)
CREATININE: 1.1 mg/dL (ref 0.6–1.3)
POTASSIUM: 3 mmol/L — AB (ref 3.4–5.3)

## 2015-04-18 LAB — BASIC METABOLIC PANEL
BUN: 12 mg/dL (ref 4–21)
Creatinine: 1.1 mg/dL (ref 0.6–1.3)
Glucose: 76 mg/dL
POTASSIUM: 3.6 mmol/L (ref 3.4–5.3)
SODIUM: 144 mmol/L (ref 137–147)

## 2015-04-20 ENCOUNTER — Encounter: Payer: Self-pay | Admitting: Internal Medicine

## 2015-04-20 DIAGNOSIS — N1330 Unspecified hydronephrosis: Secondary | ICD-10-CM | POA: Insufficient documentation

## 2015-04-20 NOTE — Assessment & Plan Note (Signed)
stable CBG while inpatient  - no need for antihyperglycemic regimen  SNF - diet controlled , pt on ARB

## 2015-04-20 NOTE — Assessment & Plan Note (Signed)
Mild, resolved; SNF - will follow with CBC

## 2015-04-20 NOTE — Assessment & Plan Note (Signed)
SNF - not stated as uncontrolled ; cont razadyne 8 mg daily

## 2015-04-20 NOTE — Assessment & Plan Note (Signed)
At d/c kidney function appears to be at baseline, Cr is WNL; SNF - will follow up BMP

## 2015-04-20 NOTE — Assessment & Plan Note (Signed)
Resolved; SNF - will follow with BMP

## 2015-04-20 NOTE — Assessment & Plan Note (Signed)
discussed with urology team, no indication for intervention since kidneys atrophic and likely chronic in nature, monitor urine output upon discharge

## 2015-04-20 NOTE — Assessment & Plan Note (Signed)
SNF - acceptable on lopressor 12.5 mg daily and cozaar 25 mg daily; monitor and make changes as needed

## 2015-04-20 NOTE — Assessment & Plan Note (Signed)
pt denies dyspnea or chest pain - CT chest with small bilateral pleural effusions, bilateral dependent atelectasis, 7 mm semi-solid nodule in the inferior right upper lobe, 10 mm area of cavitation with mild, mildly irregular diffuse wall thickening - ? chronically inflamed bulla or a small cavitary neoplasm - has been on RA this AM and with stable oxygen saturations

## 2015-04-20 NOTE — Assessment & Plan Note (Signed)
SNF - rate controlled, continue beta blocker, patient is not on anticoagulation given advanced age and high risk bleed

## 2015-05-12 ENCOUNTER — Encounter: Payer: Self-pay | Admitting: Internal Medicine

## 2015-05-12 ENCOUNTER — Non-Acute Institutional Stay (SKILLED_NURSING_FACILITY): Payer: Medicare Other | Admitting: Internal Medicine

## 2015-05-12 DIAGNOSIS — I480 Paroxysmal atrial fibrillation: Secondary | ICD-10-CM | POA: Diagnosis not present

## 2015-05-12 DIAGNOSIS — I10 Essential (primary) hypertension: Secondary | ICD-10-CM | POA: Diagnosis not present

## 2015-05-12 DIAGNOSIS — F0390 Unspecified dementia without behavioral disturbance: Secondary | ICD-10-CM | POA: Diagnosis not present

## 2015-05-12 NOTE — Progress Notes (Signed)
MRN: [id number] Name: Kevin DamesJames S Kinder  Sex: male Age: 82 y.o. DOB: 1927-05-27  PSC #:  Facility/Room: Level Of Care: SNF Provider: Merrilee SeashoreALEXANDER, ANNE D Emergency Contacts: Extended Emergency Contact Information Primary Emergency Contact: Cecelia ByarsJuchatz,Michelle  [place] Home Phone: [phone] Relation: Legal Guardian Secondary Emergency Contact: Carvel GettingBlackmon,Rose  [place] Home Phone: [phone] Relation: Other  Code Status:   Allergies: Review of patient's allergies indicates no known allergies.  Chief Complaint  Patient presents with  . Medical Management of Chronic Issues    HPI: Patient is 82 y.o. male who   Past Medical History  Diagnosis Date  . Dementia   . Anemia   . Diabetes mellitus without complication (HCC)   . Alcoholic psychosis (HCC)   . Alcohol abuse   . Subtrochanteric fracture [facility](HCC)     Past Surgical History  Procedure Laterality Date  . Cholecystectomy    . Im nailing of left femur fracture    . Cystoscopy        Medication List       This list is accurate as of: 05/12/15 12:52 PM.  Always use your most recent med list.               acetaminophen 325 MG tablet  Commonly known as:  TYLENOL  Take 650 mg by mouth every 6 (six) hours as needed for mild pain.     aspirin EC 81 MG tablet  Take 81 mg by mouth daily.     DAILY VITE PO  Take 1 tablet by mouth daily.     docusate sodium 100 MG capsule  Commonly known as:  COLACE  Take 100 mg by mouth at bedtime.     galantamine 8 MG 24 hr capsule  Commonly known as:  RAZADYNE ER  Take 8 mg by mouth daily with breakfast.     ipratropium-albuterol 0.5-2.5 (3) MG/3ML Soln  Commonly known as:  DUONEB  Take 3 mLs by nebulization every 4 (four) hours as needed.     losartan 25 MG tablet  Commonly known as:  COZAAR  Take 25 mg by mouth daily.     metoprolol tartrate 25 MG tablet  Commonly known as:  LOPRESSOR  Take 12.5 mg by mouth daily.     polyethylene  glycol packet  Commonly known as:  MIRALAX / GLYCOLAX  Take 17 g by mouth daily.     ranitidine 150 MG tablet  Commonly known as:  ZANTAC  Take 150 mg by mouth at bedtime.     THERA TEARS ALLERGY OP  Apply 1 drop to eye 4 (four) times daily.        No orders of the defined types were placed in this encounter.     There is no immunization history on file for this patient.  Social History  Substance Use Topics  . Smoking status: Unknown If Ever Smoked  . Smokeless tobacco: Not on file  . Alcohol Use: No    Review of Systems  DATA OBTAINED: from patient, nurse, medical record, family member GENERAL:  no fevers, fatigue, appetite changes SKIN: No itching, rash HEENT: No complaint RESPIRATORY: No cough, wheezing, SOB CARDIAC: No chest pain, palpitations, lower extremity edema  GI: No abdominal pain, No N/V/D or constipation, No heartburn or reflux  GU: No dysuria, frequency or urgency, or incontinence  MUSCULOSKELETAL: No unrelieved bone/joint pain NEUROLOGIC: No headache, dizziness  PSYCHIATRIC: No overt anxiety or sadness  There were no vitals filed for  this visit.  Physical Exam  GENERAL APPEARANCE: Alert, conversant, No acute distress  SKIN: No diaphoresis rash, or wounds HEENT: Unremarkable RESPIRATORY: Breathing is even, unlabored. Lung sounds are clear   CARDIOVASCULAR: Heart RRR no murmurs, rubs or gallops. No peripheral edema  GASTROINTESTINAL: Abdomen is soft, non-tender, not distended w/ normal bowel sounds.  GENITOURINARY: Bladder non tender, not distended  MUSCULOSKELETAL: No abnormal joints or musculature NEUROLOGIC: Cranial nerves 2-12 grossly intact. Moves all extremities PSYCHIATRIC: Mood and affect appropriate to situation, no behavioral issues  Patient Active Problem List   Diagnosis Date Noted  . Hydronephrosis of left kidney 04/20/2015  . Lower GI bleed   . Encounter for palliative care   . Goals of care, counseling/discussion   . Acute  renal failure superimposed on stage 3 chronic kidney disease (HCC) 04/08/2015  . Diabetes mellitus with nephropathy (HCC) 04/08/2015  . Essential hypertension 04/08/2015  . Atrial fibrillation (HCC) 04/08/2015  . Thrombocytopenia (HCC) 04/08/2015  . Hypernatremia 04/08/2015  . Hypoxia 04/08/2015  . GI bleed 04/06/2015  . Pressure ulcer 04/06/2015  . Dementia 02/09/2012    CBC    Component Value Date/Time   WBC 5.3 04/11/2015 0343   RBC 3.42* 04/11/2015 0343   HGB 12.0* 04/11/2015 0343   HCT 34.7* 04/11/2015 0343   PLT 148* 04/11/2015 0343   MCV 101.5* 04/11/2015 0343   LYMPHSABS 1.1 04/06/2015 1039   MONOABS 0.5 04/06/2015 1039   EOSABS 0.2 04/06/2015 1039   BASOSABS 0.0 04/06/2015 1039    CMP     Component Value Date/Time   NA 144 04/18/2015   NA 144 04/11/2015 0343   K 3.6 04/18/2015   CL 113* 04/11/2015 0343   CO2 24 04/11/2015 0343   GLUCOSE 110* 04/11/2015 0343   BUN 12 04/18/2015   BUN 9 04/11/2015 0343   CREATININE 1.1 04/18/2015   CREATININE 1.10 04/11/2015 0343   CALCIUM 9.1 04/11/2015 0343   PROT 6.4* 04/07/2015 0340   ALBUMIN 3.6 04/07/2015 0340   AST 28 04/07/2015 0340   ALT 14* 04/07/2015 0340   ALKPHOS 56 04/07/2015 0340   BILITOT 1.2 04/07/2015 0340   GFRNONAA 59* 04/11/2015 0343   GFRAA >60 04/11/2015 0343    Assessment and Plan  No problem-specific assessment & plan notes found for this encounter.   Margit Hanks, MD

## 2015-05-12 NOTE — Progress Notes (Signed)
Patient ID: Kevin Dawson, male   DOB: June 15, 1927, 82 y.o.   MRN: [id number] MRN: [id number] Name: Kevin Dawson  Sex: male Age: 82 y.o. DOB: February 19, 1928  PSC #: Ronni Rumble Facility/Room:213 Level Of Care: SNF Provider: Merrilee Seashore Emergency Contacts: Extended Emergency Contact Information Primary Emergency Contact: Cecelia Byars [place] Home Phone: [phone] Relation: Legal Guardian Secondary Emergency Contact: Carvel Getting [place] Home Phone: [phone] Relation: Other  Code Status:   Allergies: Review of patient's allergies indicates no known allergies.  Chief Complaint  Patient presents with  . Medical Management of Chronic Issues    Routine Visit    HPI: Patient is 82 y.o. male with dementia, anemia, diabetes, prior history of alcohol abuse with no ongoing use of alcohol at this time (verified with the nursing home) , who was brought into the ER because he was walking down the hall at his memory care unit, all covered in blood and his underware was full of blood. Pt was admitted to [facility] from 2/5-10 where his Hb remained stable and pt was deemed not a candidate for invasive work-up.Hospital Course was complicated by hypernatremia, hypokalemia and unexplained hypoxia, that resolved. Pt is admitted to SNF For generalized weakness. Pt is being seen today for routine issues of PAF, HTN and dementia. Labs ordered last month were not done. CBC amd BMP ordered again.  Past Medical History  Diagnosis Date  . Dementia   . Anemia   . Diabetes mellitus without complication (HCC)   . Alcoholic psychosis (HCC)   . Alcohol abuse   . Subtrochanteric fracture [facility])     Past Surgical History  Procedure Laterality Date  . Cholecystectomy    . Im nailing of left femur fracture    . Cystoscopy        Medication List       This list is accurate as of: 05/12/15 11:59 PM.  Always use your most recent med list.               acetaminophen 325 MG tablet  Commonly known as:  TYLENOL  Take 650 mg by mouth every 6 (six) hours as needed for mild pain.     aspirin EC 81 MG tablet  Take 81 mg by mouth daily.     DAILY VITE PO  Take 1 tablet by mouth daily.     docusate sodium 100 MG capsule  Commonly known as:  COLACE  Take 100 mg by mouth at bedtime.     galantamine 8 MG 24 hr capsule  Commonly known as:  RAZADYNE ER  Take 8 mg by mouth daily with breakfast.     ipratropium-albuterol 0.5-2.5 (3) MG/3ML Soln  Commonly known as:  DUONEB  Take 3 mLs by nebulization every 4 (four) hours as needed.     losartan 25 MG tablet  Commonly known as:  COZAAR  Take 25 mg by mouth daily.     metoprolol tartrate 25 MG tablet  Commonly known as:  LOPRESSOR  Take 12.5 mg by mouth daily.     polyethylene glycol packet  Commonly known as:  MIRALAX / GLYCOLAX  Take 17 g by mouth daily.     ranitidine 150 MG tablet  Commonly known as:  ZANTAC  Take 150 mg by mouth at bedtime.     THERA TEARS ALLERGY OP  Apply 1 drop to eye 4 (four) times daily.        No orders of the defined  types were placed in this encounter.     There is no immunization history on file for this patient.  Social History  Substance Use Topics  . Smoking status: Unknown If Ever Smoked  . Smokeless tobacco: Not on file  . Alcohol Use: No    Review of Systems  UTO 2/2 dementia; nursing without concerns    Filed Vitals:   05/12/15 1412  BP: 118/56  Pulse: 67  Temp: 98.5 F (36.9 C)  Resp: 16    Physical Exam  GENERAL APPEARANCE: Alert,No acute distress  SKIN: No diaphoresis rash HEENT: Unremarkable RESPIRATORY: Breathing is even, unlabored. Lung sounds are clear   CARDIOVASCULAR: Heart RRR no murmurs, rubs or gallops. No peripheral edema  GASTROINTESTINAL: Abdomen is soft, non-tender, not distended w/ normal bowel sounds.  GENITOURINARY: Bladder non tender, not distended  MUSCULOSKELETAL: No abnormal joints or  musculature NEUROLOGIC: Cranial nerves 2-12 grossly intact. Moves all extremities PSYCHIATRIC: dementia, no behavioral issues  Patient Active Problem List   Diagnosis Date Noted  . Hydronephrosis of left kidney 04/20/2015  . Lower GI bleed   . Encounter for palliative care   . Goals of care, counseling/discussion   . Acute renal failure superimposed on stage 3 chronic kidney disease (HCC) 04/08/2015  . Diabetes mellitus with nephropathy (HCC) 04/08/2015  . Essential hypertension 04/08/2015  . Atrial fibrillation (HCC) 04/08/2015  . Thrombocytopenia (HCC) 04/08/2015  . Hypernatremia 04/08/2015  . Hypoxia 04/08/2015  . GI bleed 04/06/2015  . Pressure ulcer 04/06/2015  . Dementia 02/09/2012    CBC    Component Value Date/Time   WBC 5.3 04/11/2015 0343   RBC 3.42* 04/11/2015 0343   HGB 12.0* 04/11/2015 0343   HCT 34.7* 04/11/2015 0343   PLT 148* 04/11/2015 0343   MCV 101.5* 04/11/2015 0343   LYMPHSABS 1.1 04/06/2015 1039   MONOABS 0.5 04/06/2015 1039   EOSABS 0.2 04/06/2015 1039   BASOSABS 0.0 04/06/2015 1039    CMP     Component Value Date/Time   NA 144 04/18/2015   NA 144 04/11/2015 0343   K 3.6 04/18/2015   CL 113* 04/11/2015 0343   CO2 24 04/11/2015 0343   GLUCOSE 110* 04/11/2015 0343   BUN 12 04/18/2015   BUN 9 04/11/2015 0343   CREATININE 1.1 04/18/2015   CREATININE 1.10 04/11/2015 0343   CALCIUM 9.1 04/11/2015 0343   PROT 6.4* 04/07/2015 0340   ALBUMIN 3.6 04/07/2015 0340   AST 28 04/07/2015 0340   ALT 14* 04/07/2015 0340   ALKPHOS 56 04/07/2015 0340   BILITOT 1.2 04/07/2015 0340   GFRNONAA 59* 04/11/2015 0343   GFRAA >60 04/11/2015 0343    Assessment and Plan  Atrial fibrillation (HCC) rate controlled, continue beta blocker, patient is not on anticoagulation given advanced age and high risk bleed    Essential hypertension acceptable on lopressor 12.5 mg daily and cozaar 25 mg daily; monitor and make changes as needed   Dementia not stated as  uncontrolled ; cont razadyne 8 mg daily  Labs ordered and not done were ordered again- CBC to follow GI bleed and BMP to follow kidney function.  Merrilee SeashoreAnne Korrin Waterfield, MD

## 2015-06-01 ENCOUNTER — Encounter: Payer: Self-pay | Admitting: Internal Medicine

## 2015-06-01 NOTE — Assessment & Plan Note (Signed)
acceptable on lopressor 12.5 mg daily and cozaar 25 mg daily; monitor and make changes as needed

## 2015-06-01 NOTE — Assessment & Plan Note (Signed)
rate controlled, continue beta blocker, patient is not on anticoagulation given advanced age and high risk bleed

## 2015-06-01 NOTE — Assessment & Plan Note (Signed)
not stated as uncontrolled ; cont razadyne 8 mg daily

## 2015-06-18 ENCOUNTER — Non-Acute Institutional Stay (SKILLED_NURSING_FACILITY): Payer: Medicare Other | Admitting: Adult Health

## 2015-06-18 ENCOUNTER — Encounter: Payer: Self-pay | Admitting: Adult Health

## 2015-06-18 DIAGNOSIS — K2961 Other gastritis with bleeding: Secondary | ICD-10-CM

## 2015-06-18 DIAGNOSIS — F0390 Unspecified dementia without behavioral disturbance: Secondary | ICD-10-CM | POA: Diagnosis not present

## 2015-06-18 DIAGNOSIS — E876 Hypokalemia: Secondary | ICD-10-CM | POA: Insufficient documentation

## 2015-06-18 DIAGNOSIS — I10 Essential (primary) hypertension: Secondary | ICD-10-CM | POA: Diagnosis not present

## 2015-06-18 DIAGNOSIS — N179 Acute kidney failure, unspecified: Secondary | ICD-10-CM | POA: Diagnosis not present

## 2015-06-18 DIAGNOSIS — I480 Paroxysmal atrial fibrillation: Secondary | ICD-10-CM

## 2015-06-18 DIAGNOSIS — N183 Chronic kidney disease, stage 3 unspecified: Secondary | ICD-10-CM

## 2015-06-18 NOTE — Progress Notes (Signed)
Patient ID: Kevin Dawson Kevin Dawson, male   DOB: [birth year], 82 y.o.   MRN: [id number]   Facility:  [facility]       No Known Allergies  Chief Complaint  Patient presents with  . Medical Management of Chronic Issues    Follow Up    HPI:  He is a long term resident of this facility being seen for the management of his chronic illnesses. Overall his status is without change. He is unable to participate in the hpi or ros. There are no nursing concerns at this time.    Past Medical History  Diagnosis Date  . Dementia   . Anemia   . Diabetes mellitus without complication (HCC)   . Alcoholic psychosis (HCC)   . Alcohol abuse   . Subtrochanteric fracture [facility](HCC)     Past Surgical History  Procedure Laterality Date  . Cholecystectomy    . Im nailing of left femur fracture    . Cystoscopy      VITAL SIGNS BP 120/66 mmHg  Pulse 72  Temp(Src) 98.1 F (36.7 C) (Oral)  Resp 16  Ht 5\' 9"  (1.753 m)  Wt 166 lb 9 oz (75.552 kg)  BMI 24.59 kg/m2  SpO2 96%  Patient'Kevin Dawson  New Prescriptions   No Dawson on file  Previous Dawson   ACETAMINOPHEN (TYLENOL) 325 MG TABLET    Take 650 mg by mouth every 6 (six) hours as needed for mild pain.   ASPIRIN EC 81 MG TABLET    Take 81 mg by mouth daily.   DOCUSATE SODIUM (COLACE) 100 MG CAPSULE    Take 100 mg by mouth at bedtime.    GALANTAMINE (RAZADYNE ER) 8 MG 24 HR CAPSULE    Take 8 mg by mouth daily with breakfast.   IPRATROPIUM-ALBUTEROL (DUONEB) 0.5-2.5 (3) MG/3ML SOLN    Take 3 mLs by nebulization every 4 (four) hours as needed.   KETOTIFEN FUMARATE (THERA TEARS ALLERGY OP)    Apply 1 drop to eye 4 (four) times daily.   LOSARTAN (COZAAR) 25 MG TABLET    Take 25 mg by mouth daily.   METOPROLOL TARTRATE (LOPRESSOR) 25 MG TABLET    Take 12.5 mg by mouth daily.   MULTIPLE VITAMIN (DAILY VITE PO)    Take 1 tablet by mouth daily.   POLYETHYLENE GLYCOL (MIRALAX / GLYCOLAX) PACKET    Take 17 g by mouth daily.   POTASSIUM CHLORIDE SA  (K-DUR,KLOR-CON) 20 MEQ TABLET    Take 20 mEq by mouth 2 (two) times daily.   RANITIDINE (ZANTAC) 150 MG TABLET    Take 150 mg by mouth at bedtime.  Modified Dawson   No Dawson on file  Discontinued Dawson   No Dawson on file     SIGNIFICANT DIAGNOSTIC EXAMS  04-08-15: ct of chest: 1. Small bilateral pleural effusions with mild bilateral dependent atelectasis. 2. 7 mm semi-solid nodule in the inferior right upper lobe. 3. 10 mm area of cavitation with mild, mildly irregular diffuse wall thickening. This could represent a chronically inflamed bulla or a small cavitary neoplasm. A follow-up chest CT without contrast is recommended in 6 months. 4. Marked chronic left renal hydronephrosis with progression and associated marked diffuse left renal atrophy with progression. 5. Stable size of a large retroperitoneal fluid collection on the left with interval wall calcifications. 6. Atheromatous arterial calcifications, including the coronary Arteries.   LABS REVIEWED:   04-06-15: wbc 5.6 hgb 13.4; hct 38.4; mcv 101.3; plt 150; glucose 154; bun  27; creat 1.29; k+ 4.3; na++142; liver normal albumin 3.5; hgb a1c 5.7; tsh 1.017 04-09-15: wbc 5.3; hgb 11.9; hct 34.5 ;mcv 102.1; plt 139; glucose 105; bun 11; creat 1.13; k+ 3.7; na++145 04-11-15: wbc 5.3; hgb 12.0; hct 34.7; mcv 101.5; plt 148; glucose 110; bun 9; creat 1.10; k+ 3.5; na++144 04-16-15: wbc 5.5 ;hgb 12.8; hct 38.1; mcv 100.4; plt 181; glucose 76; bun 15.1; creat 1.12; k+ 3.0; na++144 04-18-15: glucose 76; bun 11.8; creat 1.08; k+ 3.6; na++144      Review of Systems  Unable to perform ROS: dementia    Physical Exam  Constitutional: No distress.  Eyes: Conjunctivae are normal.  Neck: Neck supple. No JVD present. No thyromegaly present.  Cardiovascular: Normal rate, regular rhythm and intact distal pulses.   Respiratory: Effort normal and breath sounds normal. No respiratory distress. He has no wheezes.  GI: Soft.  Bowel sounds are normal. He exhibits no distension. There is no tenderness.  Musculoskeletal: He exhibits no edema.  Able to move all extremities   Lymphadenopathy:    He has no cervical adenopathy.  Neurological: He is alert.  Skin: Skin is warm and dry. He is not diaphoretic.  Psychiatric: He has a normal mood and affect.     ASSESSMENT/ PLAN:  1. Hypertension: will continue cozaar 25 mg daily and lopressor 12. 5 mg daily   2. Dementia: no significant change in his status; will continue razadyne er 8 mg daily   3. Hypokalemia: will continue k+ 20 meq twice daily k+ is 3.6  4. Afib: heart rate is stable; will continue asa 81 mg daily and lopressor 12.5 mg daily for rate control   5. Constipation: will continue colace daily and miralax daily   6. GI bleed: will continue zantac 150 mg nightly   7. Chronic renal disease stage III: bun/creat are: 11.8/1.08 will monitor       Synthia Innocent NP [facility]  Contact [phone] Monday through Friday 8am- 5pm  After hours call [phone]

## 2015-07-01 ENCOUNTER — Encounter: Payer: Self-pay | Admitting: Adult Health

## 2015-07-01 ENCOUNTER — Non-Acute Institutional Stay (SKILLED_NURSING_FACILITY): Payer: Medicare Other | Admitting: Adult Health

## 2015-07-01 DIAGNOSIS — N183 Chronic kidney disease, stage 3 unspecified: Secondary | ICD-10-CM

## 2015-07-01 DIAGNOSIS — R600 Localized edema: Secondary | ICD-10-CM | POA: Diagnosis not present

## 2015-07-01 DIAGNOSIS — I129 Hypertensive chronic kidney disease with stage 1 through stage 4 chronic kidney disease, or unspecified chronic kidney disease: Secondary | ICD-10-CM | POA: Insufficient documentation

## 2015-07-01 MED ORDER — FUROSEMIDE 20 MG PO TABS: 20.0000 mg | ORAL_TABLET | Freq: Every day | ORAL | Status: DC

## 2015-07-01 NOTE — Progress Notes (Signed)
Patient ID: Kevin Dawson, male   DOB: [birth year], 82 y.o.   MRN: [id number]    Facility:  [facility]     CODE STATUS: DNR  No Known Allergies  Chief Complaint  Patient presents with  . Acute Visit    Edema    HPI:  Staff reports that he has bilateral lower extremity edema. He is unable to participate in the hpi or ros; but there are no indications of discomfort present. There are no reports of any shortness of breath present. He is presently not taking medication for his edema.   Past Medical History  Diagnosis Date  . Dementia   . Anemia   . Diabetes mellitus without complication (HCC)   . Alcoholic psychosis (HCC)   . Alcohol abuse   . Subtrochanteric fracture [facility](HCC)     Past Surgical History  Procedure Laterality Date  . Cholecystectomy    . Im nailing of left femur fracture    . Cystoscopy      Social History   Social History  . Marital Status: Widowed    Spouse Name: N/A  . Number of Children: N/A  . Years of Education: N/A   Occupational History  . Not on file.   Social History Main Topics  . Smoking status: Unknown If Ever Smoked  . Smokeless tobacco: Not on file  . Alcohol Use: No  . Drug Use: No  . Sexual Activity: No   Other Topics Concern  . Not on file   Social History Narrative   Family History  Problem Relation Age of Onset  . Other Mother   . Hypertension Mother   . Other Father   . Hypertension Father   . Diabetes Father       VITAL SIGNS BP 141/73 mmHg  Pulse 57  Temp(Src) 98.2 F (36.8 C) (Oral)  Resp 18  Ht 5\' 9"  (1.753 m)  Wt 166 lb 9 oz (75.552 kg)  BMI 24.59 kg/m2  SpO2 96%  Patient's Medications  New Prescriptions   No medications on file  Previous Medications   ACETAMINOPHEN (TYLENOL) 325 MG TABLET    Take 650 mg by mouth every 6 (six) hours as needed for mild pain.   ASPIRIN EC 81 MG TABLET    Take 81 mg by mouth daily.   DOCUSATE SODIUM (COLACE) 100 MG CAPSULE    Take 100 mg by mouth at bedtime.    GALANTAMINE (RAZADYNE ER) 8 MG 24 HR CAPSULE    Take 8 mg by mouth daily with breakfast.   IPRATROPIUM-ALBUTEROL (DUONEB) 0.5-2.5 (3) MG/3ML SOLN    Take 3 mLs by nebulization every 4 (four) hours as needed.   KETOTIFEN FUMARATE (THERA TEARS ALLERGY OP)    Apply 1 drop to eye 4 (four) times daily.   LOSARTAN (COZAAR) 25 MG TABLET    Take 25 mg by mouth daily.   MELATONIN 5 MG TABS    Take 1 tablet by mouth at bedtime.   METOPROLOL TARTRATE (LOPRESSOR) 25 MG TABLET    Take 12.5 mg by mouth daily.   MULTIPLE VITAMIN (DAILY VITE PO)    Take 1 tablet by mouth daily.   POLYETHYLENE GLYCOL (MIRALAX / GLYCOLAX) PACKET    Take 17 g by mouth daily.   POTASSIUM CHLORIDE SA (K-DUR,KLOR-CON) 20 MEQ TABLET    Take 20 mEq by mouth 2 (two) times daily.   RANITIDINE (ZANTAC) 150 MG TABLET    Take 150 mg by mouth at bedtime.  Modified  Medications   No medications on file  Discontinued Medications   No medications on file     SIGNIFICANT DIAGNOSTIC EXAMS  04-08-15: ct of chest: 1. Small bilateral pleural effusions with mild bilateral dependent atelectasis. 2. 7 mm semi-solid nodule in the inferior right upper lobe. 3. 10 mm area of cavitation with mild, mildly irregular diffuse wall thickening. This could represent a chronically inflamed bulla or a small cavitary neoplasm. A follow-up chest CT without contrast is recommended in 6 months. 4. Marked chronic left renal hydronephrosis with progression and associated marked diffuse left renal atrophy with progression. 5. Stable size of a large retroperitoneal fluid collection on the left with interval wall calcifications. 6. Atheromatous arterial calcifications, including the coronary Arteries.   LABS REVIEWED:   04-06-15: wbc 5.6 hgb 13.4; hct 38.4; mcv 101.3; plt 150; glucose 154; bun 27; creat 1.29; k+ 4.3; na++142; liver normal albumin 3.5; hgb a1c 5.7; tsh 1.017 04-09-15: wbc 5.3; hgb 11.9; hct 34.5 ;mcv 102.1; plt 139; glucose 105; bun 11; creat 1.13; k+  3.7; na++145 04-11-15: wbc 5.3; hgb 12.0; hct 34.7; mcv 101.5; plt 148; glucose 110; bun 9; creat 1.10; k+ 3.5; na++144 04-16-15: wbc 5.5 ;hgb 12.8; hct 38.1; mcv 100.4; plt 181; glucose 76; bun 15.1; creat 1.12; k+ 3.0; na++144 04-18-15: glucose 76; bun 11.8; creat 1.08; k+ 3.6; na++144      Review of Systems  Unable to perform ROS: dementia    Physical Exam  Constitutional: No distress.  Eyes: Conjunctivae are normal.  Neck: Neck supple. No JVD present. No thyromegaly present.  Cardiovascular: Normal rate, regular rhythm and intact distal pulses.   Respiratory: Effort normal and breath sounds normal. No respiratory distress. He has no wheezes.  GI: Soft. Bowel sounds are normal. He exhibits no distension. There is no tenderness.  Musculoskeletal:  Able to move all extremities Has 2+ lower extremity edema    Lymphadenopathy:    He has no cervical adenopathy.  Neurological: He is alert.  Skin: Skin is warm and dry. He is not diaphoretic.  Psychiatric: He has a normal mood and affect.     ASSESSMENT/ PLAN:  1. Chronic renal disease stage III: bun/creat are: 11.8/1.08 will monitor   2. Lower extremity edema: will use knee high ted hose daily and will begin lasix 20 mg daily will check bmp in one week.          Synthia Innocent NP [facility]  Contact [phone] Monday through Friday 8am- 5pm  After hours call [phone]

## 2015-07-15 ENCOUNTER — Non-Acute Institutional Stay (SKILLED_NURSING_FACILITY): Payer: Medicare Other | Admitting: Adult Health

## 2015-07-15 ENCOUNTER — Encounter: Payer: Self-pay | Admitting: Adult Health

## 2015-07-15 DIAGNOSIS — I480 Paroxysmal atrial fibrillation: Secondary | ICD-10-CM

## 2015-07-15 DIAGNOSIS — K219 Gastro-esophageal reflux disease without esophagitis: Secondary | ICD-10-CM

## 2015-07-15 DIAGNOSIS — N183 Chronic kidney disease, stage 3 unspecified: Secondary | ICD-10-CM

## 2015-07-15 DIAGNOSIS — I129 Hypertensive chronic kidney disease with stage 1 through stage 4 chronic kidney disease, or unspecified chronic kidney disease: Secondary | ICD-10-CM | POA: Diagnosis not present

## 2015-07-15 DIAGNOSIS — R600 Localized edema: Secondary | ICD-10-CM | POA: Diagnosis not present

## 2015-07-15 DIAGNOSIS — F0390 Unspecified dementia without behavioral disturbance: Secondary | ICD-10-CM | POA: Diagnosis not present

## 2015-07-15 DIAGNOSIS — E876 Hypokalemia: Secondary | ICD-10-CM

## 2015-07-15 NOTE — Progress Notes (Signed)
Patient ID: Kevin DamesJames S Ellett, male   DOB: Sep 13, 1927, 82 y.o.   MRN: [id number]   Location:  [facility] Room Number: 213-A Place of Service:  SNF ([id number])   CODE STATUS: DNR  No Known Allergies  Chief Complaint  Patient presents with  . Medical Management of Chronic Issues    Follow up    HPI:  He is a long term resident of this facility being seen for the management of his chronic illnesses. Overall there is little change in his status he is tolerating the lasix without difficulty. There are no nursing concerns at this time. He is unable to participate in the hpi or ros.    Past Medical History  Diagnosis Date  . Dementia   . Anemia   . Diabetes mellitus without complication (HCC)   . Alcoholic psychosis (HCC)   . Alcohol abuse   . Subtrochanteric fracture [facility](HCC)     Past Surgical History  Procedure Laterality Date  . Cholecystectomy    . Im nailing of left femur fracture    . Cystoscopy      Social History   Social History  . Marital Status: Widowed    Spouse Name: N/A  . Number of Children: N/A  . Years of Education: N/A   Occupational History  . Not on file.   Social History Main Topics  . Smoking status: Unknown If Ever Smoked  . Smokeless tobacco: Not on file  . Alcohol Use: No  . Drug Use: No  . Sexual Activity: No   Other Topics Concern  . Not on file   Social History Narrative   Family History  Problem Relation Age of Onset  . Other Mother   . Hypertension Mother   . Other Father   . Hypertension Father   . Diabetes Father       VITAL SIGNS BP 154/76 mmHg  Pulse 63  Temp(Src) 97.5 F (36.4 C) (Oral)  Resp 18  Ht 5\' 9"  (1.753 m)  Wt 168 lb (76.204 kg)  BMI 24.80 kg/m2  SpO2 95%  Patient's Medications  New Prescriptions   No medications on file  Previous Medications   ACETAMINOPHEN (TYLENOL) 325 MG TABLET    Take 650 mg by mouth every 6 (six) hours as needed for mild pain.   ASPIRIN EC 81 MG  TABLET    Take 81 mg by mouth daily.   DOCUSATE SODIUM (COLACE) 100 MG CAPSULE    Take 100 mg by mouth at bedtime.    FUROSEMIDE (LASIX) 20 MG TABLET    Take 1 tablet (20 mg total) by mouth daily.   GALANTAMINE (RAZADYNE ER) 8 MG 24 HR CAPSULE    Take 8 mg by mouth daily with breakfast.   IPRATROPIUM-ALBUTEROL (DUONEB) 0.5-2.5 (3) MG/3ML SOLN    Take 3 mLs by nebulization every 4 (four) hours as needed.   LOSARTAN (COZAAR) 25 MG TABLET    Take 25 mg by mouth daily.   MELATONIN 5 MG TABS    Take 1 tablet by mouth at bedtime.   METOPROLOL TARTRATE (LOPRESSOR) 25 MG TABLET    Take 12.5 mg by mouth daily.   MULTIPLE VITAMIN (DAILY VITE PO)    Take 1 tablet by mouth daily.   POLYETHYL GLYCOL-PROPYL GLYCOL (SYSTANE) 0.4-0.3 % SOLN    Place 1 drop into both eyes 4 (four) times daily.   POLYETHYLENE GLYCOL (MIRALAX / GLYCOLAX) PACKET    Take 17 g by mouth  daily.   POTASSIUM CHLORIDE SA (K-DUR,KLOR-CON) 20 MEQ TABLET    Take 20 mEq by mouth daily.    RANITIDINE (ZANTAC) 150 MG TABLET    Take 150 mg by mouth at bedtime.  Modified Medications   No medications on file  Discontinued Medications   KETOTIFEN FUMARATE (THERA TEARS ALLERGY OP)    Apply 1 drop to eye 4 (four) times daily. Reported on 07/15/2015     SIGNIFICANT DIAGNOSTIC EXAMS  04-08-15: ct of chest: 1. Small bilateral pleural effusions with mild bilateral dependent atelectasis. 2. 7 mm semi-solid nodule in the inferior right upper lobe. 3. 10 mm area of cavitation with mild, mildly irregular diffuse wall thickening. This could represent a chronically inflamed bulla or a small cavitary neoplasm. A follow-up chest CT without contrast is recommended in 6 months. 4. Marked chronic left renal hydronephrosis with progression and associated marked diffuse left renal atrophy with progression. 5. Stable size of a large retroperitoneal fluid collection on the left with interval wall calcifications. 6. Atheromatous arterial calcifications, including the  coronary Arteries.   LABS REVIEWED:   04-06-15: wbc 5.6 hgb 13.4; hct 38.4; mcv 101.3; plt 150; glucose 154; bun 27; creat 1.29; k+ 4.3; na++142; liver normal albumin 3.5; hgb a1c 5.7; tsh 1.017 04-09-15: wbc 5.3; hgb 11.9; hct 34.5 ;mcv 102.1; plt 139; glucose 105; bun 11; creat 1.13; k+ 3.7; na++145 04-11-15: wbc 5.3; hgb 12.0; hct 34.7; mcv 101.5; plt 148; glucose 110; bun 9; creat 1.10; k+ 3.5; na++144 04-16-15: wbc 5.5 ;hgb 12.8; hct 38.1; mcv 100.4; plt 181; glucose 76; bun 15.1; creat 1.12; k+ 3.0; na++144 04-18-15: glucose 76; bun 11.8; creat 1.08; k+ 3.6; na++144      Review of Systems  Unable to perform ROS: dementia    Physical Exam  Constitutional: No distress.  Eyes: Conjunctivae are normal.  Neck: Neck supple. No JVD present. No thyromegaly present.  Cardiovascular: Normal rate, regular rhythm and intact distal pulses.   Respiratory: Effort normal and breath sounds normal. No respiratory distress. He has no wheezes.  GI: Soft. Bowel sounds are normal. He exhibits no distension. There is no tenderness.  Musculoskeletal:  Able to move all extremities Has 2+ lower extremity edema    Lymphadenopathy:    He has no cervical adenopathy.  Neurological: He is alert.  Skin: Skin is warm and dry. He is not diaphoretic.  Psychiatric: He has a normal mood and affect.     ASSESSMENT/ PLAN:  1. Chronic renal disease stage III: bun/creat are: 11.8/1.08 will monitor   2. Lower extremity edema: will use knee high ted hose daily and continue  lasix 20 mg daily   3. Hypertension; will continue cozaar 25 mg daily and lopressor 12.5 mg daily   4. Hypokalemia: will continue k+ 20 meq daily   5. Gerd: will continue zantac 150 mg daily  6. Constipation: will continue miralax daily and colace daily   7.  Afib: heart rate controlled; will continue lopressor 12.5 mg daily for rate control and will continue asa 81 mg daily   8. Dementia: no significant change in status; will continue  razadyne er 8 mg daily   Will check bmp      Synthia Innocent NP [facility]  Contact [phone] Monday through Friday 8am- 5pm  After hours call [phone]

## 2015-07-16 LAB — BASIC METABOLIC PANEL
BUN: 16 mg/dL (ref 4–21)
Creatinine: 1.1 mg/dL (ref 0.6–1.3)
Glucose: 70 mg/dL
POTASSIUM: 3.7 mmol/L (ref 3.4–5.3)
Sodium: 142 mmol/L (ref 137–147)

## 2015-07-28 DIAGNOSIS — K219 Gastro-esophageal reflux disease without esophagitis: Secondary | ICD-10-CM | POA: Insufficient documentation

## 2015-07-29 ENCOUNTER — Non-Acute Institutional Stay (SKILLED_NURSING_FACILITY): Payer: Medicare Other | Admitting: Adult Health

## 2015-07-29 ENCOUNTER — Encounter: Payer: Self-pay | Admitting: Adult Health

## 2015-07-29 DIAGNOSIS — L8990 Pressure ulcer of unspecified site, unspecified stage: Secondary | ICD-10-CM

## 2015-07-29 NOTE — Progress Notes (Signed)
Patient ID: Kevin Dawson, male   DOB: Nov 23, 1927, 82 y.o.   MRN: [id number]   [facility] Room Number: 213-A Place of Service:  SNF (31)   CODE STATUS: DNR  No Known Allergies  Chief Complaint  Patient presents with  . Acute Visit    Wound check    HPI:  The nursing staff has me to evaluate his posterior left hip ulceration. There is slough present in the wound bed. He is not complaining of pain present; there are no indications of infection present. There are reports of fever present.   Past Medical History  Diagnosis Date  . Dementia   . Anemia   . Diabetes mellitus without complication (HCC)   . Alcoholic psychosis (HCC)   . Alcohol abuse   . Subtrochanteric fracture [facility](HCC)     Past Surgical History  Procedure Laterality Date  . Cholecystectomy    . Im nailing of left femur fracture    . Cystoscopy      Social History   Social History  . Marital Status: Widowed    Spouse Name: N/A  . Number of Children: N/A  . Years of Education: N/A   Occupational History  . Not on file.   Social History Main Topics  . Smoking status: Unknown If Ever Smoked  . Smokeless tobacco: Not on file  . Alcohol Use: No  . Drug Use: No  . Sexual Activity: No   Other Topics Concern  . Not on file   Social History Narrative   Family History  Problem Relation Age of Onset  . Other Mother   . Hypertension Mother   . Other Father   . Hypertension Father   . Diabetes Father       VITAL SIGNS BP 134/61 mmHg  Pulse 51  Temp(Src) 98.1 F (36.7 C) (Oral)  Resp 18  Ht 5\' 9"  (1.753 m)  Wt 168 lb (76.204 kg)  BMI 24.80 kg/m2  SpO2 97%  Patient's Medications  New Prescriptions   No medications on file  Previous Medications   ACETAMINOPHEN (TYLENOL) 325 MG TABLET    Take 650 mg by mouth every 6 (six) hours as needed for mild pain.   ASPIRIN EC 81 MG TABLET    Take 81 mg by mouth daily.   DOCUSATE SODIUM (COLACE) 100 MG  CAPSULE    Take 100 mg by mouth at bedtime.    FUROSEMIDE (LASIX) 20 MG TABLET    Take 1 tablet (20 mg total) by mouth daily.   GALANTAMINE (RAZADYNE ER) 8 MG 24 HR CAPSULE    Take 8 mg by mouth daily with breakfast.   IPRATROPIUM-ALBUTEROL (DUONEB) 0.5-2.5 (3) MG/3ML SOLN    Take 3 mLs by nebulization every 4 (four) hours as needed.   LOSARTAN (COZAAR) 25 MG TABLET    Take 25 mg by mouth daily.   MELATONIN 5 MG TABS    Take 2 tablets by mouth at bedtime.    METOPROLOL TARTRATE (LOPRESSOR) 25 MG TABLET    Take 12.5 mg by mouth daily.   MULTIPLE VITAMIN (DAILY VITE PO)    Take 1 tablet by mouth daily.   POLYETHYL GLYCOL-PROPYL GLYCOL (SYSTANE) 0.4-0.3 % SOLN    Place 1 drop into both eyes 4 (four) times daily.   POLYETHYLENE GLYCOL (MIRALAX / GLYCOLAX) PACKET    Take 17 g by mouth daily.   POTASSIUM CHLORIDE SA (K-DUR,KLOR-CON) 20 MEQ TABLET    Take  20 mEq by mouth daily.    RANITIDINE (ZANTAC) 150 MG TABLET    Take 150 mg by mouth at bedtime.  Modified Medications   No medications on file  Discontinued Medications   No medications on file     SIGNIFICANT DIAGNOSTIC EXAMS  04-08-15: ct of chest: 1. Small bilateral pleural effusions with mild bilateral dependent atelectasis. 2. 7 mm semi-solid nodule in the inferior right upper lobe. 3. 10 mm area of cavitation with mild, mildly irregular diffuse wall thickening. This could represent a chronically inflamed bulla or a small cavitary neoplasm. A follow-up chest CT without contrast is recommended in 6 months. 4. Marked chronic left renal hydronephrosis with progression and associated marked diffuse left renal atrophy with progression. 5. Stable size of a large retroperitoneal fluid collection on the left with interval wall calcifications. 6. Atheromatous arterial calcifications, including the coronary Arteries.   LABS REVIEWED:   04-06-15: wbc 5.6 hgb 13.4; hct 38.4; mcv 101.3; plt 150; glucose 154; bun 27; creat 1.29; k+ 4.3; na++142; liver  normal albumin 3.5; hgb a1c 5.7; tsh 1.017 04-09-15: wbc 5.3; hgb 11.9; hct 34.5 ;mcv 102.1; plt 139; glucose 105; bun 11; creat 1.13; k+ 3.7; na++145 04-11-15: wbc 5.3; hgb 12.0; hct 34.7; mcv 101.5; plt 148; glucose 110; bun 9; creat 1.10; k+ 3.5; na++144 04-16-15: wbc 5.5 ;hgb 12.8; hct 38.1; mcv 100.4; plt 181; glucose 76; bun 15.1; creat 1.12; k+ 3.0; na++144 04-18-15: glucose 76; bun 11.8; creat 1.08; k+ 3.6; na++144      Review of Systems  Unable to perform ROS: dementia    Physical Exam  Constitutional: No distress.  Eyes: Conjunctivae are normal.  Neck: Neck supple. No JVD present. No thyromegaly present.  Cardiovascular: Normal rate, regular rhythm and intact distal pulses.   Respiratory: Effort normal and breath sounds normal. No respiratory distress. He has no wheezes.  GI: Soft. Bowel sounds are normal. He exhibits no distension. There is no tenderness.  Musculoskeletal:  Able to move all extremities Has 1+ lower extremity edema    Lymphadenopathy:    He has no cervical adenopathy.  Neurological: He is alert.  Skin: Skin is warm and dry. He is not diaphoretic.  Left posterior wound: is unstagable with slough in the pink wound bed. There are no signs of infection or inflammation present.  Psychiatric: He has a normal mood and affect.     ASSESSMENT/ PLAN:  1. Left hip ulcer: will use santyl and dressing daily and will monitor his status.      Synthia Innocent NP [facility]  Contact [phone] Monday through Friday 8am- 5pm  After hours call [phone]

## 2015-08-13 ENCOUNTER — Encounter: Payer: Self-pay | Admitting: Adult Health

## 2015-08-13 ENCOUNTER — Non-Acute Institutional Stay (SKILLED_NURSING_FACILITY): Payer: Medicare Other | Admitting: Adult Health

## 2015-08-13 DIAGNOSIS — E876 Hypokalemia: Secondary | ICD-10-CM | POA: Diagnosis not present

## 2015-08-13 DIAGNOSIS — N183 Chronic kidney disease, stage 3 unspecified: Secondary | ICD-10-CM

## 2015-08-13 DIAGNOSIS — F0390 Unspecified dementia without behavioral disturbance: Secondary | ICD-10-CM

## 2015-08-13 DIAGNOSIS — I129 Hypertensive chronic kidney disease with stage 1 through stage 4 chronic kidney disease, or unspecified chronic kidney disease: Secondary | ICD-10-CM | POA: Diagnosis not present

## 2015-08-13 DIAGNOSIS — R600 Localized edema: Secondary | ICD-10-CM | POA: Diagnosis not present

## 2015-08-13 DIAGNOSIS — I480 Paroxysmal atrial fibrillation: Secondary | ICD-10-CM | POA: Diagnosis not present

## 2015-08-13 DIAGNOSIS — K219 Gastro-esophageal reflux disease without esophagitis: Secondary | ICD-10-CM | POA: Diagnosis not present

## 2015-08-13 NOTE — Progress Notes (Signed)
Patient ID: Kevin Dawson, male   DOB: Jul 12, 1927, 82 y.o.   MRN: [id number]   Location:  [facility] Room Number: 213-A Place of Service:  SNF (31)   CODE STATUS: DNR  No Known Allergies  Chief Complaint  Patient presents with  . Medical Management of Chronic Issues    Follow up    HPI:  He is a long term resident of this facility being seen for the management of his chronic illnesses. Overall there is little change in his status. He does get out of bed daily and does socialize with others. He is unable to participate in the hpi or ros; but did tell me he is happy. There are no nursing concerns at this time.    Past Medical History  Diagnosis Date  . Dementia   . Anemia   . Diabetes mellitus without complication (HCC)   . Alcoholic psychosis (HCC)   . Alcohol abuse   . Subtrochanteric fracture [facility](HCC)     Past Surgical History  Procedure Laterality Date  . Cholecystectomy    . Im nailing of left femur fracture    . Cystoscopy      Social History   Social History  . Marital Status: Widowed    Spouse Name: N/A  . Number of Children: N/A  . Years of Education: N/A   Occupational History  . Not on file.   Social History Main Topics  . Smoking status: Unknown If Ever Smoked  . Smokeless tobacco: Not on file  . Alcohol Use: No  . Drug Use: No  . Sexual Activity: No   Other Topics Concern  . Not on file   Social History Narrative   Family History  Problem Relation Age of Onset  . Other Mother   . Hypertension Mother   . Other Father   . Hypertension Father   . Diabetes Father       VITAL SIGNS BP 116/76 mmHg  Pulse 60  Temp(Src) 97.5 F (36.4 C) (Oral)  Resp 16  Ht 5\' 9"  (1.753 m)  Wt 167 lb (75.751 kg)  BMI 24.65 kg/m2  SpO2 97%  Patient's Medications  New Prescriptions   No medications on file  Previous Medications   ACETAMINOPHEN (TYLENOL) 325 MG TABLET    Take 650 mg by mouth every 6 (six) hours as  needed for mild pain.   ASPIRIN EC 81 MG TABLET    Take 81 mg by mouth daily.   COLLAGENASE (SANTYL) OINTMENT    Apply 1 application topically daily.   DOCUSATE SODIUM (COLACE) 100 MG CAPSULE    Take 100 mg by mouth at bedtime.    FUROSEMIDE (LASIX) 20 MG TABLET    Take 1 tablet (20 mg total) by mouth daily.   GALANTAMINE (RAZADYNE ER) 8 MG 24 HR CAPSULE    Take 8 mg by mouth daily with breakfast.   IPRATROPIUM-ALBUTEROL (DUONEB) 0.5-2.5 (3) MG/3ML SOLN    Take 3 mLs by nebulization every 4 (four) hours as needed.   LOSARTAN (COZAAR) 25 MG TABLET    Take 25 mg by mouth daily.   MELATONIN 5 MG TABS    Take 2 tablets by mouth at bedtime.    METOPROLOL TARTRATE (LOPRESSOR) 25 MG TABLET    Take 12.5 mg by mouth daily.   MULTIPLE VITAMIN (DAILY VITE PO)    Take 1 tablet by mouth daily.   NUTRITIONAL SUPPLEMENTS (NUTRITIONAL SUPPLEMENT PO)    Take by  mouth. Puree texture, regular consistency   POLYETHYL GLYCOL-PROPYL GLYCOL (SYSTANE) 0.4-0.3 % SOLN    Place 1 drop into both eyes 4 (four) times daily.   POLYETHYLENE GLYCOL (MIRALAX / GLYCOLAX) PACKET    Take 17 g by mouth daily.   POTASSIUM CHLORIDE SA (K-DUR,KLOR-CON) 20 MEQ TABLET    Take 20 mEq by mouth daily.    RANITIDINE (ZANTAC) 150 MG TABLET    Take 150 mg by mouth at bedtime.  Modified Medications   No medications on file  Discontinued Medications   No medications on file     SIGNIFICANT DIAGNOSTIC EXAMS  04-08-15: ct of chest: 1. Small bilateral pleural effusions with mild bilateral dependent atelectasis. 2. 7 mm semi-solid nodule in the inferior right upper lobe. 3. 10 mm area of cavitation with mild, mildly irregular diffuse wall thickening. This could represent a chronically inflamed bulla or a small cavitary neoplasm. A follow-up chest CT without contrast is recommended in 6 months. 4. Marked chronic left renal hydronephrosis with progression and associated marked diffuse left renal atrophy with progression. 5. Stable size of a  large retroperitoneal fluid collection on the left with interval wall calcifications. 6. Atheromatous arterial calcifications, including the coronary Arteries.   LABS REVIEWED:   04-06-15: wbc 5.6 hgb 13.4; hct 38.4; mcv 101.3; plt 150; glucose 154; bun 27; creat 1.29; k+ 4.3; na++142; liver normal albumin 3.5; hgb a1c 5.7; tsh 1.017 04-09-15: wbc 5.3; hgb 11.9; hct 34.5 ;mcv 102.1; plt 139; glucose 105; bun 11; creat 1.13; k+ 3.7; na++145 04-11-15: wbc 5.3; hgb 12.0; hct 34.7; mcv 101.5; plt 148; glucose 110; bun 9; creat 1.10; k+ 3.5; na++144 04-16-15: wbc 5.5 ;hgb 12.8; hct 38.1; mcv 100.4; plt 181; glucose 76; bun 15.1; creat 1.12; k+ 3.0; na++144 04-18-15: glucose 76; bun 11.8; creat 1.08; k+ 3.6; na++144  07-16-15: glucose 70; bun 15.8;creat 1.06; k+ 3.7; na++ 142      Review of Systems  Unable to perform ROS: dementia    Physical Exam  Constitutional: No distress.  Eyes: Conjunctivae are normal.  Neck: Neck supple. No JVD present. No thyromegaly present.  Cardiovascular: Normal rate, regular rhythm and intact distal pulses.   Respiratory: Effort normal and breath sounds normal. No respiratory distress. He has no wheezes.  GI: Soft. Bowel sounds are normal. He exhibits no distension. There is no tenderness.  Musculoskeletal:  Able to move all extremities Has 1+ lower extremity edema    Lymphadenopathy:    He has no cervical adenopathy.  Neurological: He is alert.  Skin: Skin is warm and dry. He is not diaphoretic.    Psychiatric: He has a normal mood and affect.     ASSESSMENT/ PLAN:   1. Chronic renal disease stage III: bun/creat are: 15.8/1.06 will monitor   2. Lower extremity edema: will continue  lasix 20 mg daily   3. Hypertension; will continue cozaar 25 mg daily and lopressor 12.5 mg daily   4. Hypokalemia: will continue k+ 20 meq daily  K+ 3.7  5. Gerd: will continue zantac 150 mg daily  6. Constipation: will continue miralax daily and colace daily   7.   Afib: heart rate controlled; will continue lopressor 12.5 mg daily for rate control and will continue asa 81 mg daily   8. Dementia: no significant change in status; will continue razadyne er 8 mg daily       Synthia Innocent NP [facility]  Contact [phone] Monday through Friday 8am- 5pm  After hours call  [phone]  

## 2015-09-24 ENCOUNTER — Encounter: Payer: Self-pay | Admitting: Adult Health

## 2015-09-24 ENCOUNTER — Non-Acute Institutional Stay (SKILLED_NURSING_FACILITY): Payer: Medicare Other | Admitting: Adult Health

## 2015-09-24 DIAGNOSIS — L03116 Cellulitis of left lower limb: Secondary | ICD-10-CM

## 2015-09-24 NOTE — Progress Notes (Signed)
Patient ID: Kevin Dawson, male   DOB: [birth year], 82 y.o.   MRN: [id number]   Location:   [facility] Room Number: 213-A Place of Service:  SNF (31)   CODE STATUS: DNR  No Known Allergies  Chief Complaint  Patient presents with  . Acute Visit    HPI:  I have been asked to see Kevin Dawson regarding his left lower leg. His leg is inflamed and the staff is concerned about cellulitis. His leg is tender to touch. There are no reports of fever present. He is unable to fully participate in the hpi or ros.    Past Medical History:  Diagnosis Date  . Alcohol abuse   . Alcoholic psychosis (HCC)   . Anemia   . Dementia   . Diabetes mellitus without complication (HCC)   . Subtrochanteric fracture [facility])     Past Surgical History:  Procedure Laterality Date  . CHOLECYSTECTOMY    . CYSTOSCOPY    . IM NAILING OF LEFT FEMUR FRACTURE      Social History   Social History  . Marital status: Widowed    Spouse name: N/A  . Number of children: N/A  . Years of education: N/A   Occupational History  . Not on file.   Social History Main Topics  . Smoking status: Unknown If Ever Smoked  . Smokeless tobacco: Not on file  . Alcohol use No  . Drug use: No  . Sexual activity: No   Other Topics Concern  . Not on file   Social History Narrative  . No narrative on file   Family History  Problem Relation Age of Onset  . Other Mother   . Hypertension Mother   . Other Father   . Hypertension Father   . Diabetes Father       VITAL SIGNS BP (!) 154/78   Pulse 68   Temp 98.1 F (36.7 C) (Oral)   Resp 18   Ht  (1.753 m)   Wt 160 lb 8 oz (72.8 kg)   SpO2 96%   BMI 23.70 kg/m   Patient's Medications  New Prescriptions   No medications on file  Previous Medications   ACETAMINOPHEN (TYLENOL) 325 MG TABLET    Take 650 mg by mouth every 6 (six) hours as needed for mild pain.   ASCORBIC ACID (VITAMIN C) 500 MG TABLET    Take 500 mg by mouth 2 (two) times  daily.   ASPIRIN EC 81 MG TABLET    Take 81 mg by mouth daily.   COLLAGENASE (SANTYL) OINTMENT    Apply 1 application topically daily.   DOCUSATE SODIUM (COLACE) 100 MG CAPSULE    Take 100 mg by mouth at bedtime.    DOXYCYCLINE (DORYX) 100 MG EC TABLET    Take 100 mg by mouth 2 (two) times daily.   FUROSEMIDE (LASIX) 20 MG TABLET    Take 1 tablet (20 mg total) by mouth daily.   GALANTAMINE (RAZADYNE ER) 8 MG 24 HR CAPSULE    Take 8 mg by mouth daily with breakfast.   IPRATROPIUM-ALBUTEROL (DUONEB) 0.5-2.5 (3) MG/3ML SOLN    Take 3 mLs by nebulization every 4 (four) hours as needed.   LOSARTAN (COZAAR) 25 MG TABLET    Take 25 mg by mouth daily.   MELATONIN 5 MG TABS    Take 2 tablets by mouth at bedtime.    METOPROLOL TARTRATE (LOPRESSOR) 25 MG TABLET    Take 12.5  mg by mouth daily.   MULTIPLE VITAMIN (DAILY VITE PO)    Take 1 tablet by mouth daily.   NUTRITIONAL SUPPLEMENTS (NUTRITIONAL SUPPLEMENT PO)    Take by mouth. Puree texture, regular consistency   POLYETHYL GLYCOL-PROPYL GLYCOL (SYSTANE) 0.4-0.3 % SOLN    Place 1 drop into both eyes 4 (four) times daily.   POLYETHYLENE GLYCOL (MIRALAX / GLYCOLAX) PACKET    Take 17 g by mouth daily.   POTASSIUM CHLORIDE SA (K-DUR,KLOR-CON) 20 MEQ TABLET    Take 20 mEq by mouth daily.    PROBIOTIC CAPS    Take 1 capsule by mouth 2 (two) times daily.   RANITIDINE (ZANTAC) 150 MG TABLET    Take 150 mg by mouth at bedtime.   ZINC SULFATE 220 (50 ZN) MG CAPSULE    Take 220 mg by mouth daily.  Modified Medications   No medications on file  Discontinued Medications   No medications on file     SIGNIFICANT DIAGNOSTIC EXAMS   04-08-15: ct of chest: 1. Small bilateral pleural effusions with mild bilateral dependent atelectasis. 2. 7 mm semi-solid nodule in the inferior right upper lobe. 3. 10 mm area of cavitation with mild, mildly irregular diffuse wall thickening. This could represent a chronically inflamed bulla or a small cavitary neoplasm. A follow-up  chest CT without contrast is recommended in 6 months. 4. Marked chronic left renal hydronephrosis with progression and associated marked diffuse left renal atrophy with progression. 5. Stable size of a large retroperitoneal fluid collection on the left with interval wall calcifications. 6. Atheromatous arterial calcifications, including the coronary Arteries.   LABS REVIEWED:   04-06-15: wbc 5.6 hgb 13.4; hct 38.4; mcv 101.3; plt 150; glucose 154; bun 27; creat 1.29; k+ 4.3; na++142; liver normal albumin 3.5; hgb a1c 5.7; tsh 1.017 04-09-15: wbc 5.3; hgb 11.9; hct 34.5 ;mcv 102.1; plt 139; glucose 105; bun 11; creat 1.13; k+ 3.7; na++145 04-11-15: wbc 5.3; hgb 12.0; hct 34.7; mcv 101.5; plt 148; glucose 110; bun 9; creat 1.10; k+ 3.5; na++144 04-16-15: wbc 5.5 ;hgb 12.8; hct 38.1; mcv 100.4; plt 181; glucose 76; bun 15.1; creat 1.12; k+ 3.0; na++144 04-18-15: glucose 76; bun 11.8; creat 1.08; k+ 3.6; na++144  07-16-15: glucose 70; bun 15.8;creat 1.06; k+ 3.7; na++ 142      Review of Systems  Unable to perform ROS: dementia    Physical Exam  Constitutional: No distress.  Eyes: Conjunctivae are normal.  Neck: Neck supple. No JVD present. No thyromegaly present.  Cardiovascular: Normal rate, regular rhythm and intact distal pulses.   Respiratory: Effort normal and breath sounds normal. No respiratory distress. He has no wheezes.  GI: Soft. Bowel sounds are normal. He exhibits no distension. There is no tenderness.  Musculoskeletal:  Able to move all extremities Has 1+ lower extremity edema    Lymphadenopathy:    He has no cervical adenopathy.  Neurological: He is alert.  Skin: Skin is warm and dry. He is not diaphoretic.   left lower extremity: red hot inflamed without open lesions present  Psychiatric: He has a normal mood and affect.     ASSESSMENT/ PLAN:   1. Left lower leg cellulitis: will begin doxycycline 100 mg twice daily for 14 days with probiotic.    Synthia Innocent  NP [facility]  Contact [phone] Monday through Friday 8am- 5pm  After hours call [phone]

## 2015-09-29 ENCOUNTER — Non-Acute Institutional Stay (SKILLED_NURSING_FACILITY): Payer: Medicare Other | Admitting: Internal Medicine

## 2015-09-29 ENCOUNTER — Encounter: Payer: Self-pay | Admitting: Internal Medicine

## 2015-09-29 DIAGNOSIS — I129 Hypertensive chronic kidney disease with stage 1 through stage 4 chronic kidney disease, or unspecified chronic kidney disease: Secondary | ICD-10-CM | POA: Diagnosis not present

## 2015-09-29 DIAGNOSIS — R600 Localized edema: Secondary | ICD-10-CM | POA: Diagnosis not present

## 2015-09-29 DIAGNOSIS — N183 Chronic kidney disease, stage 3 unspecified: Secondary | ICD-10-CM

## 2015-09-29 NOTE — Progress Notes (Addendum)
MRN: [id number] Name: Kevin Dawson  Sex: male Age: 82 y.o. DOB: 1928/02/15  PSC #:  Facility/Room: [facility] / 213 A Level Of Care: SNF Provider: Randon Goldsmith. Lyn Hollingshead, MD Emergency Contacts: Extended Emergency Contact Information Primary Emergency Contact: Cecelia Byars [place] Home Phone: [phone] Relation: Legal Guardian Secondary Emergency Contact: Carvel Getting [place] Home Phone: [phone] Relation: Other  Code Status: DNR  Allergies: Review of patient's allergies indicates no known allergies.  Chief Complaint  Patient presents with  . Medical Management of Chronic Issues    Routine Visit    HPI: Patient is 82 y.o. male who is being seen for routine issues of CKD3, BLE edema and HTN.  Past Medical History:  Diagnosis Date  . Alcohol abuse   . Alcoholic psychosis (HCC)   . Anemia   . Dementia   . Diabetes mellitus without complication (HCC)   . Subtrochanteric fracture [facility])     Past Surgical History:  Procedure Laterality Date  . CHOLECYSTECTOMY    . CYSTOSCOPY    . IM NAILING OF LEFT FEMUR FRACTURE        Medication List       Accurate as of 09/29/15  8:46 AM. Always use your most recent med list.          acetaminophen 325 MG tablet Commonly known as:  TYLENOL Take 650 mg by mouth every 6 (six) hours as needed for mild pain.   ascorbic acid 500 MG tablet Commonly known as:  VITAMIN C Take 500 mg by mouth 2 (two) times daily.   aspirin EC 81 MG tablet Take 81 mg by mouth daily.   collagenase ointment Commonly known as:  SANTYL Apply 1 application topically daily.   DAILY VITE PO Take 1 tablet by mouth daily.   docusate sodium 100 MG capsule Commonly known as:  COLACE Take 100 mg by mouth at bedtime.   doxycycline 100 MG EC tablet Commonly known as:  DORYX Take 100 mg by mouth 2 (two) times daily.   furosemide 20 MG tablet Commonly known as:  LASIX Take 1 tablet (20 mg total) by mouth  daily.   galantamine 8 MG 24 hr capsule Commonly known as:  RAZADYNE ER Take 8 mg by mouth daily with breakfast.   ipratropium-albuterol 0.5-2.5 (3) MG/3ML Soln Commonly known as:  DUONEB Take 3 mLs by nebulization every 4 (four) hours as needed.   losartan 25 MG tablet Commonly known as:  COZAAR Take 25 mg by mouth daily.   Melatonin 5 MG Tabs Take 2 tablets by mouth at bedtime.   metoprolol tartrate 25 MG tablet Commonly known as:  LOPRESSOR Take 12.5 mg by mouth daily.   NUTRITIONAL SUPPLEMENT PO Take by mouth. Puree texture, regular consistency   polyethylene glycol packet Commonly known as:  MIRALAX / GLYCOLAX Take 17 g by mouth daily.   potassium chloride SA 20 MEQ tablet Commonly known as:  K-DUR,KLOR-CON Take 20 mEq by mouth daily.   Probiotic Caps Take 1 capsule by mouth 2 (two) times daily.   ranitidine 150 MG tablet Commonly known as:  ZANTAC Take 150 mg by mouth at bedtime.   SYSTANE 0.4-0.3 % Soln Generic drug:  Polyethyl Glycol-Propyl Glycol Place 1 drop into both eyes 4 (four) times daily.   zinc sulfate 220 (50 Zn) MG capsule Take 220 mg by mouth daily.       No orders of the defined types were placed in this encounter.  There is no immunization history on file for this patient.  Social History  Substance Use Topics  . Smoking status: Unknown If Ever Smoked  . Smokeless tobacco: Not on file  . Alcohol use No    Review of Systems - UTO 2/2 dementia; nursing report no concerns;per noted pt was started om docy  5 days ago for LLE cellulitis    Vitals:   09/29/15 0841  BP: (!) 154/78  Pulse: 68  Resp: 18  Temp: 98.1 F (36.7 C)    Physical Exam  GENERAL APPEARANCE: Alert, , No acute distress  SKIN: LLE/foot redness appears improved HEENT: Unremarkable RESPIRATORY: Breathing is even, unlabored. Lung sounds are clear   CARDIOVASCULAR: Heart RRR no murmurs, rubs or gallops. 1+ peripheral edema  GASTROINTESTINAL: Abdomen is  soft, non-tender, not distended w/ normal bowel sounds.  GENITOURINARY: Bladder non tender, not distended  MUSCULOSKELETAL: No abnormal joints or musculature NEUROLOGIC: Cranial nerves 2-12 grossly intact. Moves all extremities PSYCHIATRIC: Mood and affect appropriate to situation with dementia, no behavioral issues  Patient Active Problem List   Diagnosis Date Noted  . GERD without esophagitis 07/28/2015  . Benign hypertension with CKD (chronic kidney disease) stage III 07/01/2015  . Chronic kidney disease (CKD), stage III (moderate) 07/01/2015  . Bilateral lower extremity edema 07/01/2015  . Dementia without behavioral disturbance 06/18/2015  . Hypokalemia 06/18/2015  . Hydronephrosis of left kidney 04/20/2015  . Lower GI bleed   . Diabetes mellitus with nephropathy (HCC) 04/08/2015  . Atrial fibrillation (HCC) 04/08/2015  . Thrombocytopenia (HCC) 04/08/2015  . Hypernatremia 04/08/2015  . GI bleed 04/06/2015  . Pressure ulcer 04/06/2015    CBC    Component Value Date/Time   WBC 5.3 04/11/2015 0343   RBC 3.42 (L) 04/11/2015 0343   HGB 12.0 (L) 04/11/2015 0343   HCT 34.7 (L) 04/11/2015 0343   PLT 148 (L) 04/11/2015 0343   MCV 101.5 (H) 04/11/2015 0343   LYMPHSABS 1.1 04/06/2015 1039   MONOABS 0.5 04/06/2015 1039   EOSABS 0.2 04/06/2015 1039   BASOSABS 0.0 04/06/2015 1039    CMP     Component Value Date/Time   NA 142 07/16/2015   K 3.7 07/16/2015   CL 113 (H) 04/11/2015 0343   CO2 24 04/11/2015 0343   GLUCOSE 110 (H) 04/11/2015 0343   BUN 16 07/16/2015   CREATININE 1.1 07/16/2015   CREATININE 1.10 04/11/2015 0343   CALCIUM 9.1 04/11/2015 0343   PROT 6.4 (L) 04/07/2015 0340   ALBUMIN 3.6 04/07/2015 0340   AST 28 04/07/2015 0340   ALT 14 (L) 04/07/2015 0340   ALKPHOS 56 04/07/2015 0340   BILITOT 1.2 04/07/2015 0340   GFRNONAA 59 (L) 04/11/2015 0343   GFRAA >60 04/11/2015 0343    Assessment and Plan  CKD stage 3-  bun/creat are: 15.8/1.06 will monitor    BLE EDEMA-  STABLE;will continue  lasix 20 mg daily   HTN - controlled; plan continue cozaar 25 mg daily and lopressor 12.5 mg daily     Josilynn Losh D. Lyn Hollingshead, MD

## 2015-10-01 ENCOUNTER — Non-Acute Institutional Stay (SKILLED_NURSING_FACILITY): Payer: Medicare Other | Admitting: Adult Health

## 2015-10-01 ENCOUNTER — Encounter: Payer: Self-pay | Admitting: Adult Health

## 2015-10-01 DIAGNOSIS — M869 Osteomyelitis, unspecified: Secondary | ICD-10-CM

## 2015-10-01 DIAGNOSIS — I96 Gangrene, not elsewhere classified: Secondary | ICD-10-CM | POA: Diagnosis not present

## 2015-10-01 DIAGNOSIS — I739 Peripheral vascular disease, unspecified: Secondary | ICD-10-CM | POA: Diagnosis not present

## 2015-10-01 LAB — BASIC METABOLIC PANEL
BUN: 21 mg/dL (ref 4–21)
Creatinine: 1 mg/dL (ref 0.6–1.3)
GLUCOSE: 115 mg/dL
POTASSIUM: 3.3 mmol/L — AB (ref 3.4–5.3)
Sodium: 145 mmol/L (ref 137–147)

## 2015-10-01 LAB — CBC AND DIFFERENTIAL
HCT: 40 % — AB (ref 41–53)
HEMOGLOBIN: 13.1 g/dL — AB (ref 13.5–17.5)
Platelets: 291 10*3/uL (ref 150–399)
WBC: 9.7 10*3/mL

## 2015-10-01 NOTE — Progress Notes (Signed)
Patient ID: Kevin Dawson, male   DOB: October 24, 1927, 82 y.o.   MRN: [id number]   Location:   [facility] Room Number: 213-A Place of Service:  SNF (31)   CODE STATUS: DNR  No Known Allergies  Chief Complaint  Patient presents with  . Acute Visit    Left leg    HPI:  I have been asked to see him for his left foot and 3rd toe. The 3rd toe is completely necrotic; there is necrosis on the dorsal side of his foot. He has ben seen by the wound doctor at this facility who was treating his toe with betadine. The distal foot is inflamed. Unable to obtain pedal pulse. He does have pain in that foot. There are no reports of fever present.   Past Medical History:  Diagnosis Date  . Alcohol abuse   . Alcoholic psychosis (HCC)   . Anemia   . Dementia   . Diabetes mellitus without complication (HCC)   . Subtrochanteric fracture [facility])     Past Surgical History:  Procedure Laterality Date  . CHOLECYSTECTOMY    . CYSTOSCOPY    . IM NAILING OF LEFT FEMUR FRACTURE      Social History   Social History  . Marital status: Widowed    Spouse name: N/A  . Number of children: N/A  . Years of education: N/A   Occupational History  . Not on file.   Social History Main Topics  . Smoking status: Unknown If Ever Smoked  . Smokeless tobacco: Not on file  . Alcohol use No  . Drug use: No  . Sexual activity: No   Other Topics Concern  . Not on file   Social History Narrative  . No narrative on file   Family History  Problem Relation Age of Onset  . Other Mother   . Hypertension Mother   . Other Father   . Hypertension Father   . Diabetes Father       VITAL SIGNS BP 130/74   Pulse (!) 58   Temp 97.9 F (36.6 C) (Oral)   Resp 18   Ht 5\' 9"  (1.753 m)   Wt 160 lb 8 oz (72.8 kg)   SpO2 96%   BMI 23.70 kg/m   Patient's Medications  New Prescriptions   No medications on file  Previous Medications   ACETAMINOPHEN (TYLENOL) 325 MG TABLET    Take 650 mg by mouth  every 6 (six) hours as needed for mild pain.   ASCORBIC ACID (VITAMIN C) 500 MG TABLET    Take 500 mg by mouth 2 (two) times daily.   ASPIRIN EC 81 MG TABLET    Take 81 mg by mouth daily.   COLLAGENASE (SANTYL) OINTMENT    Apply 1 application topically daily.   DOCUSATE SODIUM (COLACE) 100 MG CAPSULE    Take 100 mg by mouth at bedtime.    DOXYCYCLINE (DORYX) 100 MG EC TABLET    Take 100 mg by mouth 2 (two) times daily.   FUROSEMIDE (LASIX) 20 MG TABLET    Take 1 tablet (20 mg total) by mouth daily.   GALANTAMINE (RAZADYNE ER) 8 MG 24 HR CAPSULE    Take 8 mg by mouth daily with breakfast.   IPRATROPIUM-ALBUTEROL (DUONEB) 0.5-2.5 (3) MG/3ML SOLN    Take 3 mLs by nebulization every 4 (four) hours as needed.   LOSARTAN (COZAAR) 25 MG TABLET    Take 25 mg by mouth daily.  MELATONIN 5 MG TABS    Take 2 tablets by mouth at bedtime.    METOPROLOL TARTRATE (LOPRESSOR) 25 MG TABLET    Take 12.5 mg by mouth daily.   MULTIPLE VITAMIN (DAILY VITE PO)    Take 1 tablet by mouth daily.   NUTRITIONAL SUPPLEMENTS (NUTRITIONAL SUPPLEMENT PO)    Take by mouth. Puree texture, regular consistency   POLYETHYL GLYCOL-PROPYL GLYCOL (SYSTANE) 0.4-0.3 % SOLN    Place 1 drop into both eyes 4 (four) times daily.   POLYETHYLENE GLYCOL (MIRALAX / GLYCOLAX) PACKET    Take 17 g by mouth daily.   POTASSIUM CHLORIDE SA (K-DUR,KLOR-CON) 20 MEQ TABLET    Take 20 mEq by mouth daily.    PROBIOTIC CAPS    Take 1 capsule by mouth 2 (two) times daily.   RANITIDINE (ZANTAC) 150 MG TABLET    Take 150 mg by mouth at bedtime.   ZINC SULFATE 220 (50 ZN) MG CAPSULE    Take 220 mg by mouth daily.  Modified Medications   No medications on file  Discontinued Medications   No medications on file     SIGNIFICANT DIAGNOSTIC EXAMS  04-08-15: ct of chest: 1. Small bilateral pleural effusions with mild bilateral dependent atelectasis. 2. 7 mm semi-solid nodule in the inferior right upper lobe. 3. 10 mm area of cavitation with mild, mildly  irregular diffuse wall thickening. This could represent a chronically inflamed bulla or a small cavitary neoplasm. A follow-up chest CT without contrast is recommended in 6 months. 4. Marked chronic left renal hydronephrosis with progression and associated marked diffuse left renal atrophy with progression. 5. Stable size of a large retroperitoneal fluid collection on the left with interval wall calcifications. 6. Atheromatous arterial calcifications, including the coronary Arteries.   LABS REVIEWED:   04-06-15: wbc 5.6 hgb 13.4; hct 38.4; mcv 101.3; plt 150; glucose 154; bun 27; creat 1.29; k+ 4.3; na++142; liver normal albumin 3.5; hgb a1c 5.7; tsh 1.017 04-09-15: wbc 5.3; hgb 11.9; hct 34.5 ;mcv 102.1; plt 139; glucose 105; bun 11; creat 1.13; k+ 3.7; na++145 04-11-15: wbc 5.3; hgb 12.0; hct 34.7; mcv 101.5; plt 148; glucose 110; bun 9; creat 1.10; k+ 3.5; na++144 04-16-15: wbc 5.5 ;hgb 12.8; hct 38.1; mcv 100.4; plt 181; glucose 76; bun 15.1; creat 1.12; k+ 3.0; na++144 04-18-15: glucose 76; bun 11.8; creat 1.08; k+ 3.6; na++144  07-16-15: glucose 70; bun 15.8;creat 1.06; k+ 3.7; na++ 142      Review of Systems  Unable to perform ROS: dementia    Physical Exam  Constitutional: No distress.  Eyes: Conjunctivae are normal.  Neck: Neck supple. No JVD present. No thyromegaly present.  Cardiovascular: Normal rate, regular rhythm no palpable left post tib and pedal    Respiratory: Effort normal and breath sounds normal. No respiratory distress. He has no wheezes.  GI: Soft. Bowel sounds are normal. He exhibits no distension. There is no tenderness.  Musculoskeletal:  Able to move all extremities Left 3rd toe is necrotic; posterior is necrotic and swollen. The foot is red; inflamed and painful to touch Lymphadenopathy:    He has no cervical adenopathy.  Neurological: He is alert.  Skin: Skin is warm and dry. He is not diaphoretic.    Psychiatric: He has a normal mood and affect.      ASSESSMENT/ PLAN:  1. Osteomyelitis 2. Necrotic left 3rd toe 3. PAD   His MOST reads for DNR; no hospitalization; no feeding tube; IVF and abt.  I have  spoken with Dr. Lyn Hollingshead; I have left messages for his legal guardian;  Unfortunately will need to start IV abt on him; I am not certain at this time how effective this will be; will monitor him closely; if no obvious sings of improvement will stop this treatment. Will start IV vanc and zosyn Will begin roxanol 5 mg every 4 hours as needed for pain    Time spent with patient  45  minutes >50% time spent counseling; reviewing medical record; tests; labs; and developing future plan of care     Synthia Innocent NP [facility]  Contact [phone] Monday through Friday 8am- 5pm  After hours call [phone]

## 2015-10-05 DIAGNOSIS — L03116 Cellulitis of left lower limb: Secondary | ICD-10-CM | POA: Insufficient documentation

## 2015-10-31 ENCOUNTER — Encounter: Payer: Self-pay | Admitting: Adult Health

## 2015-10-31 ENCOUNTER — Non-Acute Institutional Stay (SKILLED_NURSING_FACILITY): Payer: Medicare Other | Admitting: Adult Health

## 2015-10-31 DIAGNOSIS — K219 Gastro-esophageal reflux disease without esophagitis: Secondary | ICD-10-CM

## 2015-10-31 DIAGNOSIS — N183 Chronic kidney disease, stage 3 unspecified: Secondary | ICD-10-CM

## 2015-10-31 DIAGNOSIS — I129 Hypertensive chronic kidney disease with stage 1 through stage 4 chronic kidney disease, or unspecified chronic kidney disease: Secondary | ICD-10-CM | POA: Diagnosis not present

## 2015-10-31 DIAGNOSIS — I739 Peripheral vascular disease, unspecified: Secondary | ICD-10-CM

## 2015-10-31 DIAGNOSIS — R600 Localized edema: Secondary | ICD-10-CM

## 2015-10-31 DIAGNOSIS — I480 Paroxysmal atrial fibrillation: Secondary | ICD-10-CM | POA: Diagnosis not present

## 2015-10-31 DIAGNOSIS — I96 Gangrene, not elsewhere classified: Secondary | ICD-10-CM | POA: Diagnosis not present

## 2015-10-31 NOTE — Progress Notes (Signed)
Patient ID: Kevin Dawson, male   DOB: 28-Oct-1927, 82 y.o.   MRN: [id number]    Location:   [facility] Room Number: 213-A Place of Service:  SNF (31)   CODE STATUS: DNR  No Known Allergies   Chief Complaint  Patient presents with  . Medical Management of Chronic Issues     HPI:  He is a long term resident of this facility being seen for the management of his chronic illnesses. He is followed by hospice care. The focus of his care is for comfort only. He is having increased left leg pain.  He cannot fully participate in the hpi or ros.    Past Medical History:  Diagnosis Date  . Alcohol abuse   . Alcoholic psychosis (HCC)   . Anemia   . Dementia   . Diabetes mellitus without complication (HCC)   . Subtrochanteric fracture [facility])     Past Surgical History:  Procedure Laterality Date  . CHOLECYSTECTOMY    . CYSTOSCOPY    . IM NAILING OF LEFT FEMUR FRACTURE      Social History   Social History  . Marital status: Widowed    Spouse name: N/A  . Number of children: N/A  . Years of education: N/A   Occupational History  . Not on file.   Social History Main Topics  . Smoking status: Unknown If Ever Smoked  . Smokeless tobacco: Not on file  . Alcohol use No  . Drug use: No  . Sexual activity: No   Other Topics Concern  . Not on file   Social History Narrative  . No narrative on file   Family History  Problem Relation Age of Onset  . Other Mother   . Hypertension Mother   . Other Father   . Hypertension Father   . Diabetes Father       VITAL SIGNS BP (!) 138/91   Pulse 84   Temp 98.7 F (37.1 C) (Oral)   Resp 18   Ht 5\' 9"  (1.753 m)   Wt 163 lb (73.9 kg)   SpO2 94%   BMI 24.07 kg/m   Patient's Medications  New Prescriptions   No medications on file  Previous Medications   ACETAMINOPHEN (TYLENOL) 325 MG TABLET    Take 650 mg by mouth every 6 (six) hours as needed for mild pain.   ASCORBIC ACID (VITAMIN C) 500 MG TABLET     Take 500 mg by mouth 2 (two) times daily.   ASPIRIN EC 81 MG TABLET    Take 81 mg by mouth daily.   COLLAGENASE (SANTYL) OINTMENT    Apply 1 application topically daily.   DOCUSATE SODIUM (COLACE) 100 MG CAPSULE    Take 100 mg by mouth at bedtime.    GALANTAMINE (RAZADYNE ER) 8 MG 24 HR CAPSULE    Take 8 mg by mouth daily with breakfast.   IPRATROPIUM-ALBUTEROL (DUONEB) 0.5-2.5 (3) MG/3ML SOLN    Take 3 mLs by nebulization every 4 (four) hours as needed.   LOSARTAN (COZAAR) 25 MG TABLET    Take 25 mg by mouth daily.   MELATONIN 5 MG TABS    Take 2 tablets by mouth at bedtime.    METOPROLOL TARTRATE (LOPRESSOR) 25 MG TABLET    Take 12.5 mg by mouth daily.   MORPHINE 20 MG/5ML SOLUTION    Take 5 mg by mouth every 4 (four) hours as needed for pain.   MULTIPLE VITAMIN (DAILY VITE PO)  Take 1 tablet by mouth daily.   POLYETHYL GLYCOL-PROPYL GLYCOL (SYSTANE) 0.4-0.3 % SOLN    Place 1 drop into both eyes 4 (four) times daily.   POLYETHYLENE GLYCOL (MIRALAX / GLYCOLAX) PACKET    Take 17 g by mouth daily.   POTASSIUM CHLORIDE SA (K-DUR,KLOR-CON) 20 MEQ TABLET    Take 20 mEq by mouth daily.    RANITIDINE (ZANTAC) 150 MG TABLET    Take 150 mg by mouth at bedtime.   TORSEMIDE (DEMADEX) 20 MG TABLET    Take 20 mg by mouth daily.  Modified Medications   No medications on file  Discontinued Medications   DOXYCYCLINE (DORYX) 100 MG EC TABLET    Take 100 mg by mouth 2 (two) times daily.   FUROSEMIDE (LASIX) 20 MG TABLET    Take 1 tablet (20 mg total) by mouth daily.   NUTRITIONAL SUPPLEMENTS (NUTRITIONAL SUPPLEMENT PO)    Take by mouth. Puree texture, regular consistency   PROBIOTIC CAPS    Take 1 capsule by mouth 2 (two) times daily.   ZINC SULFATE 220 (50 ZN) MG CAPSULE    Take 220 mg by mouth daily.     SIGNIFICANT DIAGNOSTIC EXAMS  04-08-15: ct of chest: 1. Small bilateral pleural effusions with mild bilateral dependent atelectasis. 2. 7 mm semi-solid nodule in the inferior right upper lobe. 3. 10  mm area of cavitation with mild, mildly irregular diffuse wall thickening. This could represent a chronically inflamed bulla or a small cavitary neoplasm. A follow-up chest CT without contrast is recommended in 6 months. 4. Marked chronic left renal hydronephrosis with progression and associated marked diffuse left renal atrophy with progression. 5. Stable size of a large retroperitoneal fluid collection on the left with interval wall calcifications. 6. Atheromatous arterial calcifications, including the coronary Arteries.   LABS REVIEWED:   04-06-15: wbc 5.6 hgb 13.4; hct 38.4; mcv 101.3; plt 150; glucose 154; bun 27; creat 1.29; k+ 4.3; na++142; liver normal albumin 3.5; hgb a1c 5.7; tsh 1.017 04-09-15: wbc 5.3; hgb 11.9; hct 34.5 ;mcv 102.1; plt 139; glucose 105; bun 11; creat 1.13; k+ 3.7; na++145 04-11-15: wbc 5.3; hgb 12.0; hct 34.7; mcv 101.5; plt 148; glucose 110; bun 9; creat 1.10; k+ 3.5; na++144 04-16-15: wbc 5.5 ;hgb 12.8; hct 38.1; mcv 100.4; plt 181; glucose 76; bun 15.1; creat 1.12; k+ 3.0; na++144 04-18-15: glucose 76; bun 11.8; creat 1.08; k+ 3.6; na++144  07-16-15: glucose 70; bun 15.8;creat 1.06; k+ 3.7; na++ 142  10-01-15: wbc 9.7; hgb 13.1; hct 39.8; mcv 102.8; plt 291; glucose 115; bun 21; creat 0.99; k+ 3.3; na++ 145      Review of Systems  Unable to perform ROS: dementia    Physical Exam  Constitutional: No distress.  Eyes: Conjunctivae are normal.  Neck: Neck supple. No JVD present. No thyromegaly present.  Cardiovascular: Normal rate, regular rhythm no palpable left post tib and pedal    Respiratory: Effort normal and breath sounds normal. No respiratory distress. He has no wheezes.  GI: Soft. Bowel sounds are normal. He exhibits no distension. There is no tenderness.  Musculoskeletal:  Able to move all extremities Left foot: 3rd toe: 3.4 x 2.8 cm silver Left foot: 2nd toe: 0.5 x 0.3 cm betadine Left foot 4th toe: 0.2 x 0.1 cm betadine Left foot and lower leg is  red and warm  Lymphadenopathy:    He has no cervical adenopathy.  Neurological: He is alert.  Skin: Skin is warm and dry. He is  not diaphoretic.    Psychiatric: He has a normal mood and affect.     ASSESSMENT/ PLAN:  1. Chronic renal disease stage III: bun/creat are: 21/0.99 will monitor   2. Lower extremity edema: will continue demadex  20 mg daily   3. Hypertension; will continue cozaar 25 mg daily and lopressor 12.5 mg daily   4. Hypokalemia: will continue k+ 20 meq daily  K+ 3.3  5. Gerd: will continue zantac 150 mg daily  6. Constipation: will continue miralax daily and colace daily   7.  Afib: heart rate controlled; will continue lopressor 12.5 mg daily for rate control and will continue asa 81 mg daily   8. Dementia: no significant change in status; will continue razadyne er 8 mg daily  9. PAD: necrotic areas on left foot: the focus of his care is for comfort; will change the oxycodone to 10 mg every 6 hours routinely and 5 mg every 3 hours as needed will continue to monitor his status.     MD is aware of resident's narcotic use and is in agreement with current plan of care. We will attempt to wean resident as apropriate   Synthia Innocent NP [facility]  Contact [phone] Monday through Friday 8am- 5pm  After hours call [phone]

## 2015-11-03 ENCOUNTER — Encounter: Payer: Self-pay | Admitting: Internal Medicine

## 2015-11-14 ENCOUNTER — Non-Acute Institutional Stay (SKILLED_NURSING_FACILITY): Payer: Medicare Other | Admitting: Internal Medicine

## 2015-11-14 DIAGNOSIS — M869 Osteomyelitis, unspecified: Secondary | ICD-10-CM | POA: Diagnosis not present

## 2015-11-22 NOTE — Progress Notes (Signed)
This is an acute visit.  Level care skilled.  [facility]  Chief complaint-his acute visit to assess left foot issues with history of gangrene.  History of present illness.  Patient is an 82 year old male with a history of a fairly long-term peripheral arterial disease with gangrene-osteomyelitis of his left foot most prominently second third and fourth toes.  He is under hospice services with emphasis on comfort care.  Nursing has asked me to look at him secondary to erythema of his distal foot on the left-speaking with nursing this is not really totally new-he was seen by the wound care physician in the facility earlier today and he was concerned apparently of the erythema.  There is suggestion possibly start an antibiotic. He has been on antibiotics before although apparently this has not grossly changed his status in regards to the wound on the foot  Nursing staff has been in contact with his guardian-who is okay with antibiotic but does not want any further aggressive measures no hospitalization again emphasis is on comfort care.  Past Medical History:  Diagnosis Date  . Alcohol abuse   . Alcoholic psychosis (HCC)   . Anemia   . Dementia   . Diabetes mellitus without complication (HCC)   . Subtrochanteric fracture [facility](HCC)          Past Surgical History:  Procedure Laterality Date  . CHOLECYSTECTOMY    . CYSTOSCOPY    . IM NAILING OF LEFT FEMUR FRACTURE      Social History        Social History  . Marital status: Widowed    Spouse name: N/A  . Number of children: N/A  . Years of education: N/A      Occupational History  . Not on file.       Social History Main Topics  . Smoking status: Unknown If Ever Smoked  . Smokeless tobacco: Not on file  . Alcohol use No  . Drug use: No  . Sexual activity: No       Other Topics Concern  . Not on file      Social History Narrative  . No narrative on file     Family History  Problem Relation Age of Onset  . Other Mother   . Hypertension Mother   . Other Father   . Hypertension Father   . Diabetes Father             Patient's Medications  New Prescriptions   No medications on file  Previous Medications   ACETAMINOPHEN (TYLENOL) 325 MG TABLET    Take 650 mg by mouth every 6 (six) hours as needed for mild pain.   ASCORBIC ACID (VITAMIN C) 500 MG TABLET    Take 500 mg by mouth 2 (two) times daily.   ASPIRIN EC 81 MG TABLET    Take 81 mg by mouth daily.   COLLAGENASE (SANTYL) OINTMENT    Apply 1 application topically daily.   DOCUSATE SODIUM (COLACE) 100 MG CAPSULE    Take 100 mg by mouth at bedtime.    GALANTAMINE (RAZADYNE ER) 8 MG 24 HR CAPSULE    Take 8 mg by mouth daily with breakfast.   IPRATROPIUM-ALBUTEROL (DUONEB) 0.5-2.5 (3) MG/3ML SOLN    Take 3 mLs by nebulization every 4 (four) hours as needed.   LOSARTAN (COZAAR) 25 MG TABLET    Take 25 mg by mouth daily.   MELATONIN 5 MG TABS    Take 2  tablets by mouth at bedtime.    METOPROLOL TARTRATE (LOPRESSOR) 25 MG TABLET    Take 12.5 mg by mouth daily.   MORPHINE 20 MG/5ML SOLUTION    Take 5 mg by mouth every 4 (four) hours as needed for pain.   MULTIPLE VITAMIN (DAILY VITE PO)    Take 1 tablet by mouth daily.   POLYETHYL GLYCOL-PROPYL GLYCOL (SYSTANE) 0.4-0.3 % SOLN    Place 1 drop into both eyes 4 (four) times daily.   POLYETHYLENE GLYCOL (MIRALAX / GLYCOLAX) PACKET    Take 17 g by mouth daily.   POTASSIUM CHLORIDE SA (K-DUR,KLOR-CON) 20 MEQ TABLET    Take 20 mEq by mouth daily.    RANITIDINE (ZANTAC) 150 MG TABLET    Take 150 mg by mouth at bedtime.   TORSEMIDE (DEMADEX) 20 MG TABLET    Take 20 mg by mouth daily.  Modified Medications   No medications on file  Discontinued Medications   DOXYCYCLINE (DORYX) 100 MG EC TABLET    Take 100 mg by mouth 2 (two) times daily.   FUROSEMIDE (LASIX) 20 MG TABLET    Take 1 tablet (20 mg total) by mouth daily.     NUTRITIONAL SUPPLEMENTS (NUTRITIONAL SUPPLEMENT PO)    Take by mouth. Puree texture, regular consistency   PROBIOTIC CAPS    Take 1 capsule by mouth 2 (two) times daily.   ZINC SULFATE 220 (50 ZN) MG CAPSULE    Take 220 mg by mouth daily.     SIGNIFICANT DIAGNOSTIC EXAMS  04-08-15: ct of chest: 1. Small bilateral pleural effusions with mild bilateral dependent atelectasis. 2. 7 mm semi-solid nodule in the inferior right upper lobe. 3. 10 mm area of cavitation with mild, mildly irregular diffuse wall thickening. This could represent a chronically inflamed bulla or a small cavitary neoplasm. A follow-up chest CT without contrast is recommended in 6 months. 4. Marked chronic left renal hydronephrosis with progression and associated marked diffuse left renal atrophy with progression. 5. Stable size of a large retroperitoneal fluid collection on the left with interval wall calcifications. 6. Atheromatous arterial calcifications, including the coronary Arteries.   LABS REVIEWED:   04-06-15: wbc 5.6 hgb 13.4; hct 38.4; mcv 101.3; plt 150; glucose 154; bun 27; creat 1.29; k+ 4.3; na++142; liver normal albumin 3.5; hgb a1c 5.7; tsh 1.017 04-09-15: wbc 5.3; hgb 11.9; hct 34.5 ;mcv 102.1; plt 139; glucose 105; bun 11; creat 1.13; k+ 3.7; na++145 04-11-15: wbc 5.3; hgb 12.0; hct 34.7; mcv 101.5; plt 148; glucose 110; bun 9; creat 1.10; k+ 3.5; na++144 04-16-15: wbc 5.5 ;hgb 12.8; hct 38.1; mcv 100.4; plt 181; glucose 76; bun 15.1; creat 1.12; k+ 3.0; na++144 04-18-15: glucose 76; bun 11.8; creat 1.08; k+ 3.6; na++144  07-16-15: glucose 70; bun 15.8;creat 1.06; k+ 3.7; na++ 142  10-01-15: wbc 9.7; hgb 13.1; hct 39.8; mcv 102.8; plt 291; glucose 115; bun 21; creat 0.99; k+ 3.3; na++ 145      Review of Systems  Unable to perform ROS: dementia   Temperature 97.7 pulse 75 respirations 16 blood pressure 107/58 Physical Exam  Constitutional: No distress. Very frail-appearing Eyes:  Conjunctivae are normal.  Neck: Neck supple. No JVD present. No thyromegaly present.  Cardiovascular: Normal rate, regula occasional irregular beat r rhythm no palpable left post tib and pedal    Respiratory: Effort normal and breath sounds normal. No respiratory distress. He has no wheezes.  GI: Soft. Bowel sounds are normal. He exhibits no distension. There is no  tenderness.  Musculoskeletal:  Able to move all extremities  Has gangrene of the second third and fourth toes-there is some erythema of the distal left foot approximately covering half the foot Left foot and lower leg is red and warm  Lymphadenopathy:    He has no cervical adenopathy.  Neurological: He is alert.  Skin: Skin is warm and dry. He is not diaphoretic.    Psychiatric: He has a normal mood and affect.    Assessment and plan.  #1 left foot issues with history of gangrene and some erythema-Will start doxycycline 100 mg twice a day for 14 days and probiotic twice a day for 14 days with thoughts  possibly this may add to his comfort-again emphasis is on comfort-at this point will monitor at this point pain appears to be controlled on current medications but this will have to be watched as well  [id number]-of note greater than 25 minutes spent assessing patient-reviewing his chart-discussing his status with nursing staff-and  coordinating a plan of care.

## 2015-12-03 ENCOUNTER — Encounter: Payer: Self-pay | Admitting: Adult Health

## 2015-12-03 ENCOUNTER — Non-Acute Institutional Stay (SKILLED_NURSING_FACILITY): Payer: Medicare Other | Admitting: Adult Health

## 2015-12-03 DIAGNOSIS — F0280 Dementia in other diseases classified elsewhere without behavioral disturbance: Secondary | ICD-10-CM | POA: Diagnosis not present

## 2015-12-03 DIAGNOSIS — N183 Chronic kidney disease, stage 3 unspecified: Secondary | ICD-10-CM

## 2015-12-03 DIAGNOSIS — I480 Paroxysmal atrial fibrillation: Secondary | ICD-10-CM

## 2015-12-03 DIAGNOSIS — I129 Hypertensive chronic kidney disease with stage 1 through stage 4 chronic kidney disease, or unspecified chronic kidney disease: Secondary | ICD-10-CM

## 2015-12-03 DIAGNOSIS — M869 Osteomyelitis, unspecified: Secondary | ICD-10-CM | POA: Diagnosis not present

## 2015-12-03 DIAGNOSIS — I739 Peripheral vascular disease, unspecified: Secondary | ICD-10-CM | POA: Diagnosis not present

## 2015-12-03 DIAGNOSIS — G301 Alzheimer's disease with late onset: Secondary | ICD-10-CM

## 2015-12-03 NOTE — Progress Notes (Signed)
Patient ID: Kevin Dawson, male   DOB: 11-Jun-1927, 82 y.o.   MRN: [id number]    Location:   [facility] Room Number: 213-A Place of Service:  SNF (31)   CODE STATUS: DNR  No Known Allergies  Chief Complaint  Patient presents with  . Medical Management of Chronic Issues    Follow up    HPI:  He is a long term resident of this facility being seen for the management of his chronic illnesses. He is followed by hospice care. He is unable to participate in the hpi or ros. He is more lethargic; he does continue to eat and drink. I have spoken with hospice and will change his pain medication to liquid form and will begin ativan concentrate as well.    Past Medical History:  Diagnosis Date  . Alcohol abuse   . Alcoholic psychosis (HCC)   . Anemia   . Dementia   . Diabetes mellitus without complication (HCC)   . Subtrochanteric fracture [facility])     Past Surgical History:  Procedure Laterality Date  . CHOLECYSTECTOMY    . CYSTOSCOPY    . IM NAILING OF LEFT FEMUR FRACTURE      Social History   Social History  . Marital status: Widowed    Spouse name: N/A  . Number of children: N/A  . Years of education: N/A   Occupational History  . Not on file.   Social History Main Topics  . Smoking status: Unknown If Ever Smoked  . Smokeless tobacco: Not on file  . Alcohol use No  . Drug use: No  . Sexual activity: No   Other Topics Concern  . Not on file   Social History Narrative  . No narrative on file   Family History  Problem Relation Age of Onset  . Other Mother   . Hypertension Mother   . Other Father   . Hypertension Father   . Diabetes Father       VITAL SIGNS BP 108/63   Pulse 73   Temp 98 F (36.7 C) (Oral)   Resp 18   Ht 5\' 9"  (1.753 m)   Wt 161 lb 2 oz (73.1 kg)   SpO2 97%   BMI 23.79 kg/m   Patient's Medications  New Prescriptions   No medications on file  Previous Medications   ACETAMINOPHEN (TYLENOL) 325 MG TABLET    Take 650  mg by mouth every 6 (six) hours as needed for mild pain.   ASCORBIC ACID (VITAMIN C) 500 MG TABLET    Take 500 mg by mouth 2 (two) times daily.   ASPIRIN EC 81 MG TABLET    Take 81 mg by mouth daily.   COLLAGENASE (SANTYL) OINTMENT    Apply 1 application topically daily.   DOCUSATE SODIUM (COLACE) 100 MG CAPSULE    Take 100 mg by mouth at bedtime.    GALANTAMINE (RAZADYNE ER) 8 MG 24 HR CAPSULE    Take 8 mg by mouth daily with breakfast.   LACTOBACILLUS (ACIDOPHOLUS PO)    Take 1 capsule by mouth 2 (two) times daily.   LOSARTAN (COZAAR) 25 MG TABLET    Take 25 mg by mouth daily.   MELATONIN 5 MG TABS    Take 2 tablets by mouth at bedtime.    METOPROLOL TARTRATE (LOPRESSOR) 25 MG TABLET    Take 12.5 mg by mouth daily.   MULTIPLE VITAMIN (DAILY VITE PO)    Take 1 tablet by  mouth daily.   OXYCODONE HCL, ABUSE DETER, (OXAYDO) 5 MG TABA    Take 3 tablets by mouth every 6 (six) hours. Also give 1 tab by mouth every 3 hours as needed.   POLYETHYL GLYCOL-PROPYL GLYCOL (SYSTANE) 0.4-0.3 % SOLN    Place 1 drop into both eyes 4 (four) times daily.   POLYETHYLENE GLYCOL (MIRALAX / GLYCOLAX) PACKET    Take 17 g by mouth daily.   POTASSIUM CHLORIDE SA (K-DUR,KLOR-CON) 20 MEQ TABLET    Take 20 mEq by mouth daily.    RANITIDINE (ZANTAC) 150 MG TABLET    Take 150 mg by mouth at bedtime.   TORSEMIDE (DEMADEX) 20 MG TABLET    Take 20 mg by mouth daily.  Modified Medications   No medications on file  Discontinued Medications   IPRATROPIUM-ALBUTEROL (DUONEB) 0.5-2.5 (3) MG/3ML SOLN    Take 3 mLs by nebulization every 4 (four) hours as needed.   MORPHINE 20 MG/5ML SOLUTION    Take 5 mg by mouth every 4 (four) hours as needed for pain.     SIGNIFICANT DIAGNOSTIC EXAMS  04-08-15: ct of chest: 1. Small bilateral pleural effusions with mild bilateral dependent atelectasis. 2. 7 mm semi-solid nodule in the inferior right upper lobe. 3. 10 mm area of cavitation with mild, mildly irregular diffuse wall thickening.  This could represent a chronically inflamed bulla or a small cavitary neoplasm. A follow-up chest CT without contrast is recommended in 6 months. 4. Marked chronic left renal hydronephrosis with progression and associated marked diffuse left renal atrophy with progression. 5. Stable size of a large retroperitoneal fluid collection on the left with interval wall calcifications. 6. Atheromatous arterial calcifications, including the coronary Arteries.   LABS REVIEWED:   04-06-15: wbc 5.6 hgb 13.4; hct 38.4; mcv 101.3; plt 150; glucose 154; bun 27; creat 1.29; k+ 4.3; na++142; liver normal albumin 3.5; hgb a1c 5.7; tsh 1.017 04-09-15: wbc 5.3; hgb 11.9; hct 34.5 ;mcv 102.1; plt 139; glucose 105; bun 11; creat 1.13; k+ 3.7; na++145 04-11-15: wbc 5.3; hgb 12.0; hct 34.7; mcv 101.5; plt 148; glucose 110; bun 9; creat 1.10; k+ 3.5; na++144 04-16-15: wbc 5.5 ;hgb 12.8; hct 38.1; mcv 100.4; plt 181; glucose 76; bun 15.1; creat 1.12; k+ 3.0; na++144 04-18-15: glucose 76; bun 11.8; creat 1.08; k+ 3.6; na++144  07-16-15: glucose 70; bun 15.8;creat 1.06; k+ 3.7; na++ 142  10-01-15: wbc 9.7; hgb 13.1; hct 39.8; mcv 102.8; plt 291; glucose 115; bun 21; creat 0.99; k+ 3.3; na++ 145      Review of Systems  Unable to perform ROS: dementia    Physical Exam  Constitutional: No distress.  Eyes: Conjunctivae are normal.  Neck: Neck supple. No JVD present. No thyromegaly present.  Cardiovascular: Normal rate, regular rhythm no palpable left post tib and pedal    Respiratory: Effort normal and breath sounds normal. No respiratory distress. He has no wheezes.  GI: Soft. Bowel sounds are normal. He exhibits no distension. There is no tenderness.  Musculoskeletal:  Able to move all extremities Left foot is necrotic toes and posterior foot.  Lymphadenopathy:    He has no cervical adenopathy.  Neurological: He is alert.  Skin: Skin is warm and dry. He is not diaphoretic.    Psychiatric: He has a normal mood and  affect.     ASSESSMENT/ PLAN:  1. Chronic renal disease stage III: bun/creat are: 21/0.99 will monitor   2. Lower extremity edema: will continue demadex  20 mg daily  3. Hypertension; will continue cozaar 25 mg daily and lopressor 12.5 mg daily   4. Hypokalemia: will continue k+ 20 meq daily  K+ 3.3  5. Gerd: will continue zantac 150 mg daily  6. Constipation: will continue miralax daily and colace daily   7.  Afib: heart rate controlled; will continue lopressor 12.5 mg daily for rate control and will continue asa 81 mg daily   8. Dementia: no significant change in status; will continue razadyne er 8 mg daily  9. PAD: necrotic areas on left foot: the focus of his care is for comfort; will begin oxyfast 15 mg every 6 hours and 10 mg every 3 hours as needed; will begin ativan 0.5 mg every 4 hours as needed    MD is aware of resident's narcotic use and is in agreement with current plan of care. We will attempt to wean resident as appropriate.     Synthia Innocenteborah Green NP [facility]  Contact [phone] Monday through Friday 8am- 5pm  After hours call [phone]

## 2015-12-31 DEATH — deceased

## 2016-02-20 IMAGING — CR DG CHEST 2V
3 series · 3 of 3 positions shown · non-contrast
Comparison: 02/09/2012

CLINICAL DATA: Dementia. Chest pain. Shortness of breath. Hypoxia.
Altered level of consciousness.

EXAM:
CHEST  2 VIEW

[chest lat (1 of 2)]
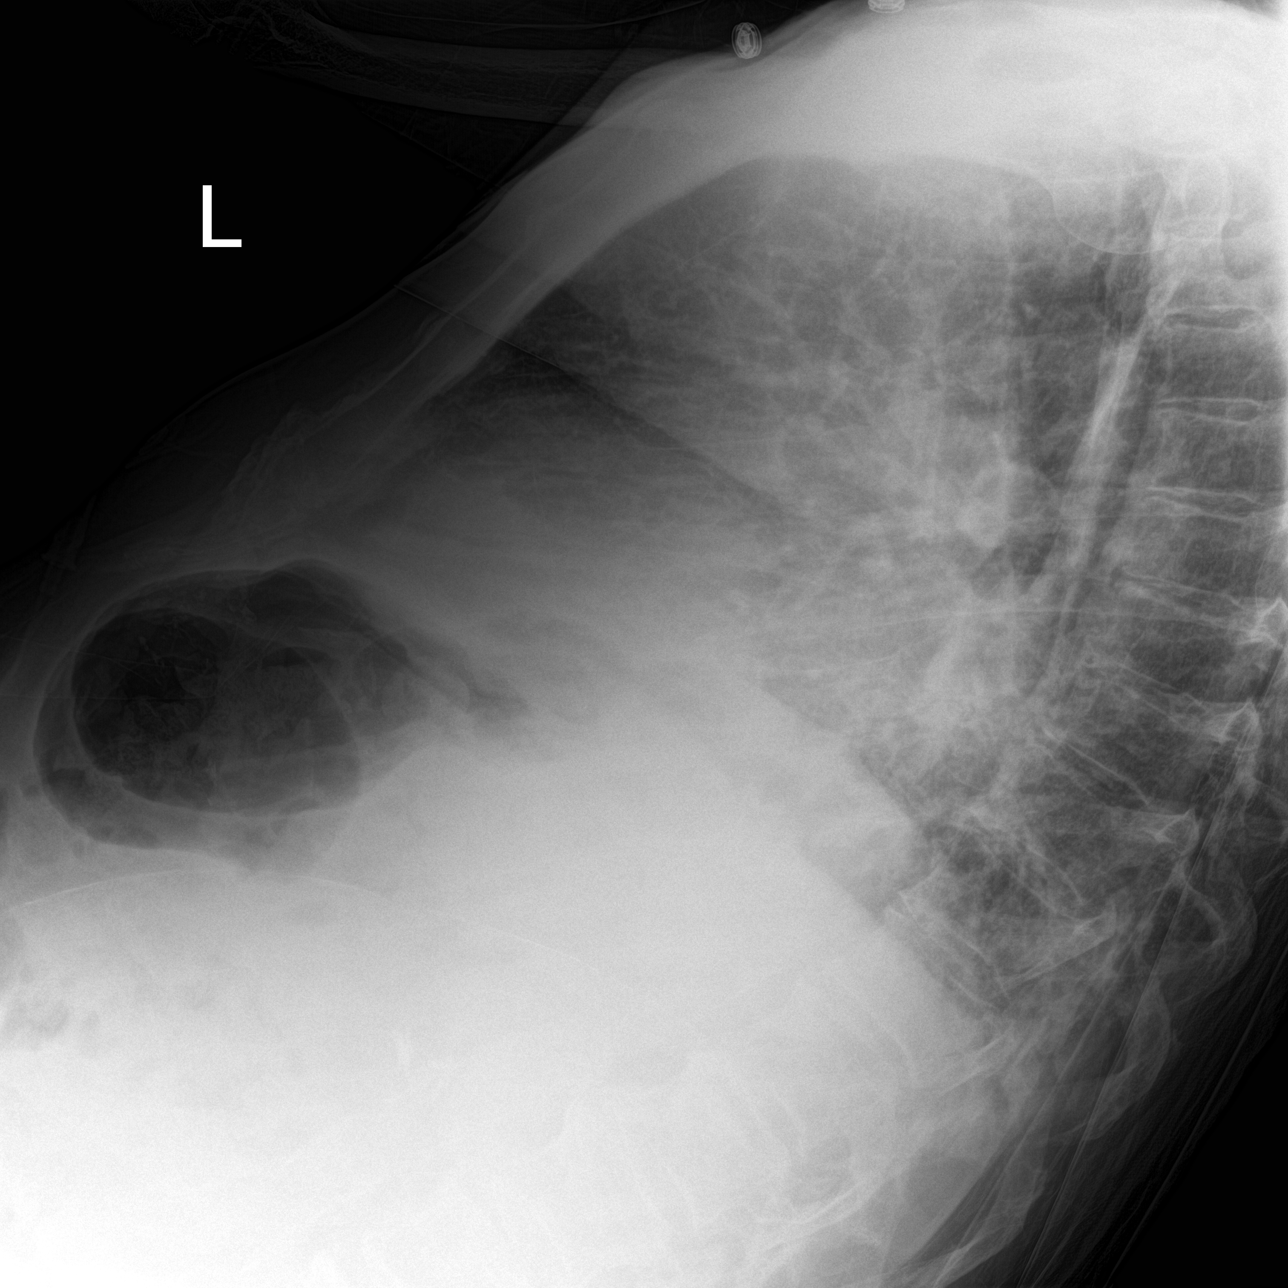

[chest ap]
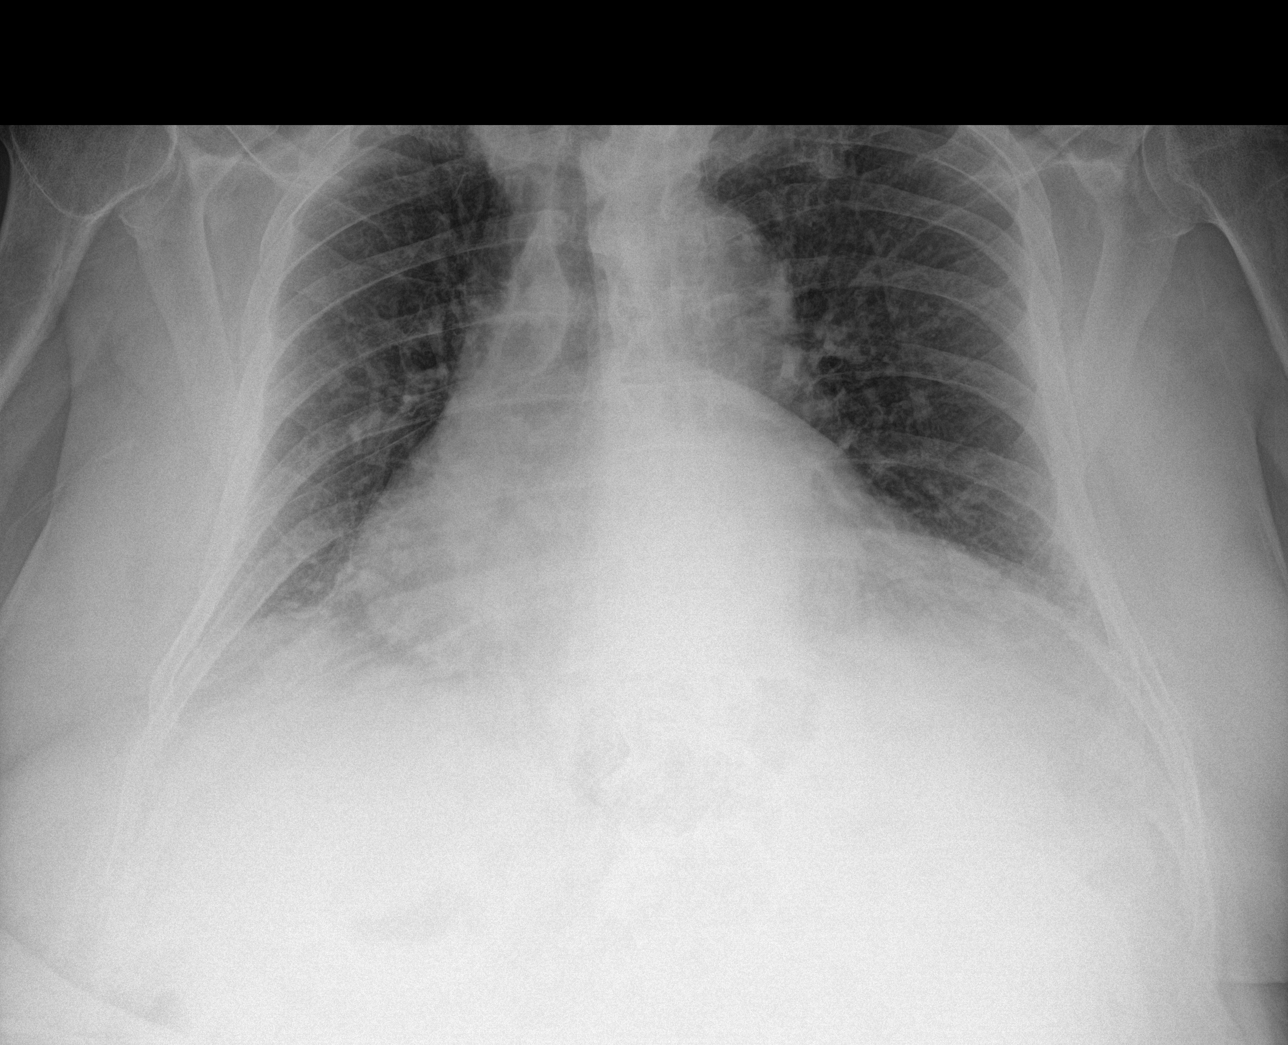

[chest lat (2 of 2)]
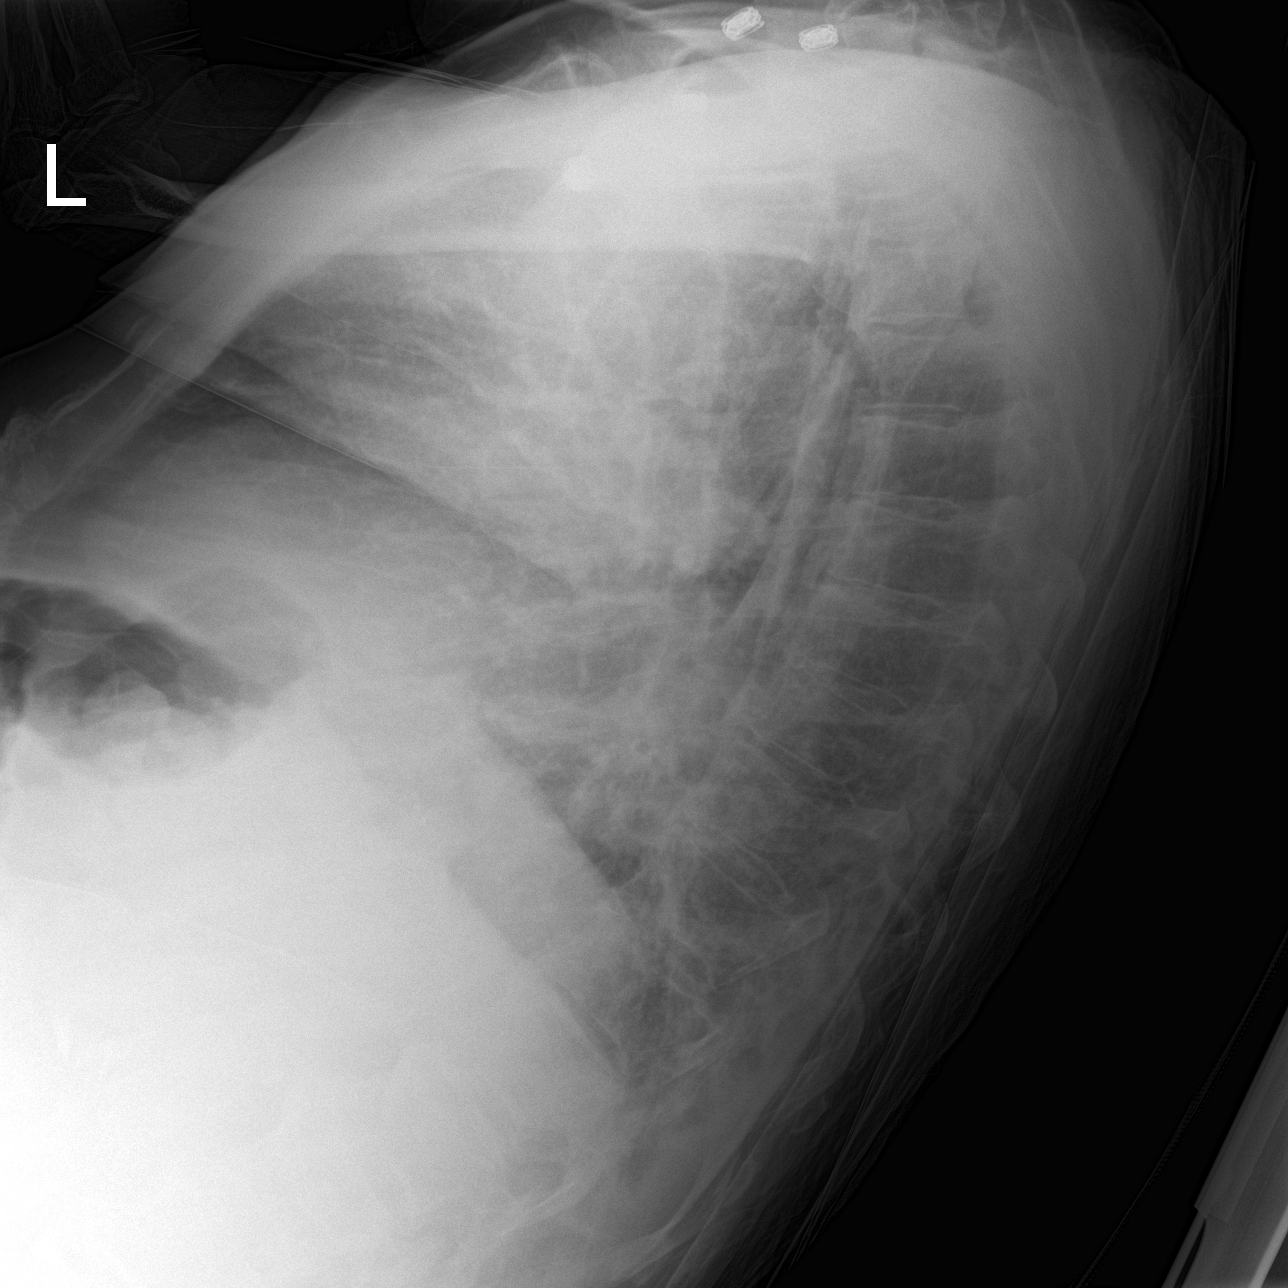

[3 of 3 positions shown; findings below may reference images not displayed]

FINDINGS: There is at least moderate enlargement of the cardiopericardial
silhouette. The patient is rotated to the Right on today's
radiograph, reducing diagnostic sensitivity and specificity.
Tortuous thoracic aorta. Upper zone pulmonary vascular prominence is
nonspecific based on semi erect AP positioning. No overt edema
identified. No significant pleural effusion.

Thoracic spondylosis.
IMPRESSION: 1. There is at least moderate enlargement of the cardiopericardial
silhouette. No overt edema.
2. Tortuous thoracic aorta.
